# Patient Record
Sex: Female | Born: 1960 | Race: White | Hispanic: No | Marital: Single | Smoking: Never smoker
Health system: Southern US, Community
[De-identification: ages and names within clinical notes are randomized; demographics above are authoritative.]

## PROBLEM LIST (undated history)

## (undated) DIAGNOSIS — Z923 Personal history of irradiation: Secondary | ICD-10-CM

## (undated) DIAGNOSIS — Z87442 Personal history of urinary calculi: Secondary | ICD-10-CM

## (undated) DIAGNOSIS — C801 Malignant (primary) neoplasm, unspecified: Secondary | ICD-10-CM

## (undated) DIAGNOSIS — M542 Cervicalgia: Secondary | ICD-10-CM

## (undated) DIAGNOSIS — E559 Vitamin D deficiency, unspecified: Secondary | ICD-10-CM

## (undated) DIAGNOSIS — I10 Essential (primary) hypertension: Secondary | ICD-10-CM

## (undated) DIAGNOSIS — R112 Nausea with vomiting, unspecified: Secondary | ICD-10-CM

## (undated) DIAGNOSIS — C50919 Malignant neoplasm of unspecified site of unspecified female breast: Secondary | ICD-10-CM

## (undated) DIAGNOSIS — Z9889 Other specified postprocedural states: Secondary | ICD-10-CM

## (undated) DIAGNOSIS — Z86010 Personal history of colonic polyps: Secondary | ICD-10-CM

## (undated) DIAGNOSIS — R350 Frequency of micturition: Secondary | ICD-10-CM

## (undated) HISTORY — PX: OTHER SURGICAL HISTORY: SHX169

## (undated) HISTORY — DX: Malignant neoplasm of unspecified site of unspecified female breast: C50.919

## (undated) HISTORY — PX: COLONOSCOPY: SHX174

## (undated) HISTORY — DX: Essential (primary) hypertension: I10

## (undated) HISTORY — DX: Malignant (primary) neoplasm, unspecified: C80.1

---

## 1982-04-17 HISTORY — PX: BACK SURGERY: SHX140

## 1996-04-17 HISTORY — PX: TUBAL LIGATION: SHX77

## 1998-04-17 HISTORY — PX: CHOLECYSTECTOMY: SHX55

## 1998-05-17 ENCOUNTER — Other Ambulatory Visit: Admission: RE | Admit: 1998-05-17 | Discharge: 1998-05-17 | Payer: Self-pay | Admitting: Gynecology

## 1999-01-27 ENCOUNTER — Other Ambulatory Visit: Admission: RE | Admit: 1999-01-27 | Discharge: 1999-01-27 | Payer: Self-pay | Admitting: Gynecology

## 1999-02-02 ENCOUNTER — Encounter: Payer: Self-pay | Admitting: Endocrinology

## 1999-02-02 ENCOUNTER — Ambulatory Visit (HOSPITAL_COMMUNITY): Admission: RE | Admit: 1999-02-02 | Discharge: 1999-02-02 | Payer: Self-pay | Admitting: Endocrinology

## 1999-02-19 ENCOUNTER — Encounter: Payer: Self-pay | Admitting: Orthopedic Surgery

## 1999-02-19 ENCOUNTER — Encounter: Admission: RE | Admit: 1999-02-19 | Discharge: 1999-02-19 | Payer: Self-pay | Admitting: Orthopedic Surgery

## 1999-03-16 ENCOUNTER — Encounter: Admission: RE | Admit: 1999-03-16 | Discharge: 1999-03-16 | Payer: Self-pay | Admitting: Orthopedic Surgery

## 1999-03-16 ENCOUNTER — Encounter: Payer: Self-pay | Admitting: Orthopedic Surgery

## 1999-04-18 HISTORY — PX: NECK SURGERY: SHX720

## 1999-10-13 ENCOUNTER — Other Ambulatory Visit: Admission: RE | Admit: 1999-10-13 | Discharge: 1999-10-13 | Payer: Self-pay | Admitting: Gynecology

## 1999-10-13 ENCOUNTER — Encounter (INDEPENDENT_AMBULATORY_CARE_PROVIDER_SITE_OTHER): Payer: Self-pay

## 1999-11-11 ENCOUNTER — Encounter: Payer: Self-pay | Admitting: Gynecology

## 1999-11-22 ENCOUNTER — Encounter (INDEPENDENT_AMBULATORY_CARE_PROVIDER_SITE_OTHER): Payer: Self-pay | Admitting: Specialist

## 1999-11-22 ENCOUNTER — Ambulatory Visit (HOSPITAL_COMMUNITY): Admission: RE | Admit: 1999-11-22 | Discharge: 1999-11-22 | Payer: Self-pay | Admitting: Gynecology

## 2000-11-10 ENCOUNTER — Ambulatory Visit (HOSPITAL_COMMUNITY): Admission: RE | Admit: 2000-11-10 | Discharge: 2000-11-10 | Payer: Self-pay | Admitting: Family Medicine

## 2000-11-10 ENCOUNTER — Encounter: Payer: Self-pay | Admitting: Family Medicine

## 2001-01-01 ENCOUNTER — Ambulatory Visit (HOSPITAL_COMMUNITY): Admission: RE | Admit: 2001-01-01 | Discharge: 2001-01-02 | Payer: Self-pay | Admitting: Neurosurgery

## 2001-01-01 ENCOUNTER — Encounter: Payer: Self-pay | Admitting: Neurosurgery

## 2001-01-24 ENCOUNTER — Encounter: Payer: Self-pay | Admitting: Neurosurgery

## 2001-01-24 ENCOUNTER — Ambulatory Visit (HOSPITAL_COMMUNITY): Admission: RE | Admit: 2001-01-24 | Discharge: 2001-01-24 | Payer: Self-pay | Admitting: Neurosurgery

## 2003-09-25 ENCOUNTER — Other Ambulatory Visit: Admission: RE | Admit: 2003-09-25 | Discharge: 2003-09-25 | Payer: Self-pay | Admitting: *Deleted

## 2003-12-11 ENCOUNTER — Encounter: Admission: RE | Admit: 2003-12-11 | Discharge: 2003-12-31 | Payer: Self-pay | Admitting: Unknown Physician Specialty

## 2010-08-16 ENCOUNTER — Encounter (HOSPITAL_BASED_OUTPATIENT_CLINIC_OR_DEPARTMENT_OTHER): Payer: Self-pay

## 2010-09-01 ENCOUNTER — Encounter (HOSPITAL_BASED_OUTPATIENT_CLINIC_OR_DEPARTMENT_OTHER): Payer: Self-pay

## 2010-09-02 NOTE — Op Note (Signed)
[place]  Patient:    Alicia Ferguson, Alicia Ferguson Visit Number: [id number] MRN: [id number]          Service Type: DSU Location: [facility] 3172 02 Attending Physician:  Gerald Dexter Dictated by:   Reinaldo Meeker, M.D. Proc. Date: 01/01/01 Admit Date:  01/01/2001                             Operative Report  PREOPERATIVE DIAGNOSIS:  Herniated disk at C6-7 left.  POSTOPERATIVE DIAGNOSIS:  Herniated disk at C6-7 left.  PROCEDURE:  C6-7 anterior cervical diskectomy with fibular bone bank fusion, followed by anterior cervical plating with operative microscope.  SURGEON:  Reinaldo Meeker, M.D.  ASSISTANT:  Donalee Citrin, Montez Hageman., M.D.  DESCRIPTION OF PROCEDURE:  After being placed in the supine position with 10 pounds of Halter traction, the patients neck was prepped and draped in the usual sterile fashion.  Localizing fluoroscopy was used prior to incision to identify the appropriate level.  Transverse incision was made in the right anterior neck, started at the midline, head towards the medial aspect of the sternocleidomastoid muscle.  The platysma was then incised transversely.  The natural fascial plane between the strap muscles medially and the sternocleidomastoid laterally was identified, followed down to the anterior aspect of the cervical spine.  The longus coli muscles were identified, split in the midline, circulated bilaterally with the ______ elevator.  A second x-ray was taken to confirm appropriate level, this was correct.  A self-retaining retractor was placed for exposure.  Using the 15 blade, the disk was incised.  Using pituitary rongeurs and curettes, approximately 90% of the disk material was removed.  Microscope was then draped, brought into the field, and used at the end of the case.  Using microdissection technique, the remainder of the disk material at the posterior longitudinal ligament was removed.  Ligament was then incised  transversely, cutters removed the Kerrison punch.  Inspection of the left C6-7 foramen yielded a large amount of herniated disk material, and this was removed until the nerve root was well visualized and obviously decompressed.  A similar decompression was carried out towards the right ______ site.  At this point, inspection was carried out in all directions without any evidence of residual compression, none could be identified.  Large amount of irrigation were carried out.  Measurements were taken, and an 8 mm bone bank plug was reconstituted.  After irrigating once more and confirming hemostasis, the plug was impacted without difficulty. Fluoroscopy showed the plug to be in good position.  A 25 mm ______ plate was then chosen.  Under fluoroscopic guidance, drill holes were placed, and 15 mm screws were placed as well.  When all four screws had been well engaged, the locking mechanism was engaged as well.  Fluoroscopy showed the plate to be in excellent position.  At this point, large amounts of irrigation was carried out.  Bleeding controlled by bipolar coagulation.  It was then closed using interrupted Vicryl on the platysma muscle, inverted 5-0 PDS in the subcutaneous layer, and Steri-Strips on the skin.  Sterile dressing was then applied.  The patient was extubated and taken to the recovery room in stable condition. Dictated by:   Reinaldo Meeker, M.D. Attending Physician:  Gerald Dexter DD:  01/01/01 TD:  01/01/01 Job: [id number]

## 2010-09-07 ENCOUNTER — Ambulatory Visit (HOSPITAL_BASED_OUTPATIENT_CLINIC_OR_DEPARTMENT_OTHER): Payer: BC Managed Care – PPO | Attending: Emergency Medicine

## 2010-09-07 DIAGNOSIS — G4733 Obstructive sleep apnea (adult) (pediatric): Secondary | ICD-10-CM | POA: Insufficient documentation

## 2010-09-11 DIAGNOSIS — G4730 Sleep apnea, unspecified: Secondary | ICD-10-CM

## 2010-09-11 DIAGNOSIS — G4710 Hypersomnia, unspecified: Secondary | ICD-10-CM

## 2010-09-11 DIAGNOSIS — R0609 Other forms of dyspnea: Secondary | ICD-10-CM

## 2010-09-11 DIAGNOSIS — R0989 Other specified symptoms and signs involving the circulatory and respiratory systems: Secondary | ICD-10-CM

## 2010-09-12 NOTE — Procedures (Signed)
NAMECHARMON, Alicia Ferguson NO.:  [id number]  MEDICAL RECORD NO.:  [id number]          PATIENT TYPE:  OUT  LOCATION:  SLEEP CENTER                 FACILITY:  [facility]  PHYSICIAN:  Kayci Belleville D. Maple Hudson, MD, FCCP, FACPDATE OF BIRTH:  March 06, 1959  DATE OF STUDY:  09/07/2010                           NOCTURNAL POLYSOMNOGRAM  REFERRING PHYSICIAN:  DAVID CHARLES KELLER  INDICATION FOR STUDY:  Hypersomnia with sleep apnea.  EPWORTH SLEEPINESS SCORE:  7/24, BMI 41.  Weight 260 pounds, height 67 inches.  Neck 16.5 inches.  MEDICATIONS:  Home medication charted and reviewed.  SLEEP ARCHITECTURE:  Total sleep time 351.5 minutes with sleep efficiency 93.5%.  Stage I was 7.4%, stage II 70.6%, stage III 0.9%, REM 21.2% of total sleep time.  Sleep latency 13 minutes, REM latency 122.5 minutes, awake after sleep onset 11.5 minutes, arousal index 6.3. Bedtime medication:  None.  RESPIRATORY DATA:  Apnea/hypopnea index (AHI) 14.3 per hour.  A total of 84 events was scored including 5 obstructive apneas and 79 hypopneas. Events were more common while supine and especially associated with REM. REM AHI 60.4 per hour.  There were insufficient early events to meet protocol requirements for initiation of CPAP titration by split protocol on this study night.  Events were conspicuously associated with REM events in the last half of the night.  OXYGEN DATA:  Moderately loud snoring with oxygen desaturation to a nadir of 74% and a mean oxygen saturation through the study of 94.5% on room air.  CARDIAC DATA:  Normal sinus rhythm.  MOVEMENT-PARASOMNIA:  No significant movement disturbance.  No bathroom trips.  IMPRESSIONS-RECOMMENDATIONS: 1. Mild obstructive sleep apnea/hypopnea syndrome, AHI 14.3 per hour,     moderately loud snoring with oxygen desaturation to a nadir of 74%     and a mean oxygen saturation through the study of 94.5% on room     air.  Events were primarily associated with  supine sleep position     (supine AHI 15 per hour) and REM (REM AHI 60.4 per hour). 2. There were insufficient early events to permit application of CPAP     titration by split titration protocol on the     study night.  Consider return for CPAP titration by dedicated study     or evaluate for alternative management as clinically appropriate.     Kaislyn Gulas D. Maple Hudson, MD, [facility], FACP Diplomate, Biomedical engineer of Sleep Medicine Electronically Signed    CDY/MEDQ  D:  09/11/2010 13:58:40  T:  09/12/2010 00:10:16  Job:  [id number]

## 2010-09-28 ENCOUNTER — Ambulatory Visit (HOSPITAL_BASED_OUTPATIENT_CLINIC_OR_DEPARTMENT_OTHER): Payer: BC Managed Care – PPO | Attending: Emergency Medicine

## 2010-09-28 DIAGNOSIS — G4710 Hypersomnia, unspecified: Secondary | ICD-10-CM | POA: Insufficient documentation

## 2010-09-28 DIAGNOSIS — G4730 Sleep apnea, unspecified: Secondary | ICD-10-CM | POA: Insufficient documentation

## 2010-10-01 DIAGNOSIS — G4710 Hypersomnia, unspecified: Secondary | ICD-10-CM

## 2010-10-01 DIAGNOSIS — G4730 Sleep apnea, unspecified: Secondary | ICD-10-CM

## 2010-10-01 NOTE — Procedures (Signed)
NAMECALIANA, SPIRES NO.:  [id number]  MEDICAL RECORD NO.:  [id number]          PATIENT TYPE:  OUT  LOCATION:  SLEEP CENTER                 FACILITY:  [facility]  PHYSICIAN:  Clinton D. Maple Hudson, MD, FCCP, FACPDATE OF BIRTH:  04[birth year]09-[birth year]  DATE OF STUDY:  09/28/2010                           NOCTURNAL POLYSOMNOGRAM  REFERRING PHYSICIAN:  DAVID CHARLES KELLER  INDICATION FOR STUDY:  Hypersomnia with sleep apnea.  EPWORTH SLEEPINESS SCORE:  Epworth sleepiness score 8/24, BMI 40.7. Weight 260 pounds, height 67 inches.  Neck 16.5 inches.  Home medications are charted and reviewed.  A baseline diagnostic NPSG on Sep 07, 2010, recorded an API of 14.3 per hour.  CPAP titration is requested.  MEDICATIONS:  SLEEP ARCHITECTURE:  Total sleep time 344.5 minutes with sleep efficiency 90.8%.  Stage I was 5.4%, stage II 76.6%, stage III absent, REM 18% of total sleep time.  Sleep latency 15 minutes, REM latency 119.5 minutes, awake after sleep onset 19 minutes, arousal index 15.3 Bedtime medication:  None.  RESPIRATORY DATA:  Split CPAP titration protocol.  CPAP was titrated to 14 CWP, AHI 0.8 per hour.  She wore a standard ResMed Mirage affects nasal mask with heated humidifier and C-flex setting of 2.  OXYGEN DATA:  Snoring was prevented by CPAP and mean oxygen saturation held 95.1% on room air.  CARDIAC DATA:  Sinus rhythm with rare PAC.  MOVEMENT-PARASOMNIA:  No significant movement disturbance.  No bathroom trips.  IMPRESSIONS-RECOMMENDATIONS: 1. Successful CPAP titration to 14 CWP, AHI 0.8 per hour.  She wore a     standard ResMed Mirage effects nasal mask with heated humidifier     and C-flex setting of 2. 2. Baseline diagnostic NPSG on Sep 07, 2010 and recorded an AHI of     14.3 per hour.     Clinton D. Maple Hudson, MD, [facility], FACP Diplomate, Biomedical engineer of Sleep Medicine Electronically Signed   CDY/MEDQ  D:  10/01/2010 09:59:24  T:  10/01/2010 13:37:29  Job:   [id number]

## 2010-11-18 ENCOUNTER — Encounter: Payer: Self-pay | Admitting: Pulmonary Disease

## 2010-11-18 ENCOUNTER — Ambulatory Visit (INDEPENDENT_AMBULATORY_CARE_PROVIDER_SITE_OTHER): Payer: BC Managed Care – PPO | Admitting: Pulmonary Disease

## 2010-11-18 VITALS — BP 124/86 | HR 76 | Temp 98.0°F | Ht 66.0 in | Wt 263.8 lb

## 2010-11-18 DIAGNOSIS — G4733 Obstructive sleep apnea (adult) (pediatric): Secondary | ICD-10-CM

## 2010-11-18 NOTE — Progress Notes (Deleted)
  Subjective:    Patient ID: Alicia Ferguson, female    DOB: 02-Jun-1960, 50 y.o.   MRN: [id number]  HPI    Review of Systems     Objective:   Physical Exam        Assessment & Plan:

## 2010-11-18 NOTE — Patient Instructions (Signed)
Will set up on cpap at 10 of pressure, and increase to 12 in 2 weeks.  Your goal pressure is 14. Work on weight loss followup with me in 5 weeks.

## 2010-11-18 NOTE — Progress Notes (Signed)
Subjective:     Patient ID: Alicia Ferguson, female   DOB: Dec 20, 1960, 50 y.o.   MRN: [id number]  HPI The pt is a 50y/o female who I have been asked to see for management of osa.  She recently underwent NPSG 08/2010 which showed mild osa, with AHI 14/hr.  Her history is significant for: -loud snoring noted, but no one has mentioned abnormal breathing pattern during sleep. -some awakenings during the night, and nonrestorative sleep noted. -denies alertness issues at work, and feels her concentration/performance is adequate -can doze on occasion at home watching tv, but no issues with sleepiness while driving.   -weight is up 12 pounds over the last 2 yrs.  -does mention that she has difficult to control hypertension.   Sleep Questionnaire: What time do you typically go to bed?( Between what hours) Between 11:30 pm and 12:00 pm How long does it take you to fall asleep? 15 minutes How many times during the night do you wake up? 2 What time do you get out of bed to start your day? 0645 Do you drive or operate heavy machinery in your occupation? No How much has your weight changed (up or down) over the past two years? (In pounds) 12 lb (5.443 kg) Have you ever had a sleep study before? Yes If yes, location of study? [facility] If yes, date of study? June 2012 Do you currently use CPAP? No Do you wear oxygen at any time? No    Review of Systems  Constitutional: Negative for fever and unexpected weight change.  HENT: Negative for ear pain, nosebleeds, congestion, sore throat, rhinorrhea, sneezing, trouble swallowing, dental problem, postnasal drip and sinus pressure.   Eyes: Negative for redness and itching.  Respiratory: Positive for shortness of breath. Negative for cough, chest tightness and wheezing.   Cardiovascular: Negative for palpitations and leg swelling.  Gastrointestinal: Negative for nausea and vomiting.  Genitourinary: Negative for dysuria.  Musculoskeletal: Negative for joint swelling.  Skin:  Negative for rash.  Neurological: Negative for headaches.  Hematological: Does not bruise/bleed easily.  Psychiatric/Behavioral: Negative for dysphoric mood. The patient is not nervous/anxious.        Objective:   Physical Exam Constitutional:  Obese female, no acute distress  HENT:  Nares patent without discharge, enlarged turbinates  Oropharynx without exudate, palate and uvula are mildly elongated.   Eyes:  Perrla, eomi, no scleral icterus  Neck:  No JVD, no TMG  Cardiovascular:  Normal rate, regular rhythm, no rubs or gallops.  No murmurs        Intact distal pulses  Pulmonary :  Normal breath sounds, no stridor or respiratory distress   No rales, rhonchi, or wheezing  Abdominal:  Soft, nondistended, bowel sounds present.  No tenderness noted.   Musculoskeletal:  mild lower extremity edema noted.  Lymph Nodes:  No cervical lymphadenopathy noted  Skin:  No cyanosis noted  Neurologic:  Alert, appropriate, moves all 4 extremities without obvious deficit.      Assessment:       Plan:

## 2010-11-24 ENCOUNTER — Encounter: Payer: Self-pay | Admitting: Pulmonary Disease

## 2010-11-24 NOTE — Assessment & Plan Note (Signed)
The pt has mild osa by her sleep study, but it does have impact on her sleep and on daytime QOL at times.  She is also concerned about its impact on her BP control.  I have outlined the various treatment options for this, including a trial of weight loss alone, upper airway surgery, dental appliance, and finally cpap.  After a long discussion, she has decided to start with cpap while trying to work on weight loss.  This will enable her to see its impact on her symptoms and BP.  I will set the patient up on cpap at a moderate pressure level to allow for desensitization, and will troubleshoot the device over the next 4-6weeks if needed.  The pt is to call me if having issues with tolerance.  Will then optimize the pressure once patient is able to wear cpap on a consistent basis.

## 2010-11-29 ENCOUNTER — Encounter: Payer: Self-pay | Admitting: Internal Medicine

## 2010-12-23 ENCOUNTER — Encounter: Payer: Self-pay | Admitting: Pulmonary Disease

## 2010-12-23 ENCOUNTER — Ambulatory Visit (INDEPENDENT_AMBULATORY_CARE_PROVIDER_SITE_OTHER): Payer: BC Managed Care – PPO | Admitting: Pulmonary Disease

## 2010-12-23 VITALS — BP 138/85 | HR 72 | Temp 98.0°F | Ht 66.0 in | Wt 269.0 lb

## 2010-12-23 DIAGNOSIS — G4733 Obstructive sleep apnea (adult) (pediatric): Secondary | ICD-10-CM

## 2010-12-23 NOTE — Patient Instructions (Signed)
Will change to full face mask.  If tolerating cpap better, please call us so we can arrange to have your pressure increased toward your optimal level.  If not having improved tolerance with mask change, please let me know.

## 2010-12-23 NOTE — Progress Notes (Signed)
  Subjective:    Patient ID: Alicia Ferguson, female    DOB: 09-22-1960, 50 y.o.   MRN: [id number]  HPI The patient comes in today for follow up of her mild obstructive sleep apnea.  She was started on CPAP at her last visit, and has had some issues with tolerance.  The patient is able to get to sleep with the device, but she frequently awakens and sleeps her period without the device, and then finally gets back to sleep with it in the wee hours of the morning.  Her download shows an average of about 5 hours per night, but this is piecemeal.  She does not feel there is an issue with pressure tolerance, but thinks the mask is causing her awakening issues.  Patient does not feel that she sleeps better or is any more rested during the day since being on CPAP.   Review of Systems  Constitutional: Positive for unexpected weight change. Negative for fever.  HENT: Positive for sore throat and sinus pressure. Negative for ear pain, nosebleeds, congestion, rhinorrhea, sneezing, trouble swallowing, dental problem and postnasal drip.   Eyes: Negative for redness and itching.  Respiratory: Negative for cough, chest tightness, shortness of breath and wheezing.   Cardiovascular: Positive for leg swelling. Negative for palpitations.  Gastrointestinal: Negative for nausea and vomiting.  Genitourinary: Negative for dysuria.  Musculoskeletal: Negative for joint swelling.  Skin: Negative for rash.  Neurological: Negative for headaches.  Hematological: Does not bruise/bleed easily.  Psychiatric/Behavioral: Negative for dysphoric mood. The patient is not nervous/anxious.        Objective:   Physical Exam Obese female in no acute distress No skin breakdown or pressure necrosis from the CPAP mask Lower extremities without edema, no cyanosis noted Alert, does not appear to be sleepy, moves all 4 extremities.       Assessment & Plan:

## 2010-12-23 NOTE — Assessment & Plan Note (Signed)
The patient has been wearing CPAP since the last visit, but is having issues with getting consistent sleep on the device.  She believes it is an issue with the mask, and therefore will change her to a full face mask as a trial.  I have also reminded her that we have yet to get her pressure to the optimal level of 14 cm.  I would like to try her on a full face mask, and if she continues to have issues with tolerance would consider a trial of desensitization with trazodone at bedtime.  I have also reminded the patient that her sleep apnea is mild in nature, and may not be the sole reason why she is having sleep issues.

## 2011-01-11 ENCOUNTER — Other Ambulatory Visit: Payer: Self-pay | Admitting: Pulmonary Disease

## 2011-01-11 ENCOUNTER — Telehealth: Payer: Self-pay | Admitting: Pulmonary Disease

## 2011-01-11 DIAGNOSIS — G4733 Obstructive sleep apnea (adult) (pediatric): Secondary | ICD-10-CM

## 2011-01-11 NOTE — Telephone Encounter (Signed)
Called and spoke with pt.  Pt aware of order sent to DME to increase pressure.

## 2011-01-11 NOTE — Telephone Encounter (Signed)
Will send an order to dme to increase pressure to 14cm.

## 2011-01-11 NOTE — Telephone Encounter (Signed)
Spoke with pt. She states that she never received FFM as suggested at last visit 12/23/10. She states that she is not having any more problems with waking up at night, and so does not want to FFM anymore, and wants to just go ahead and have pressure adjusted to 14 as she states was discussed at ov. She states that the nasal mask works fine for her. KC, pls advise, thanks!

## 2011-10-27 ENCOUNTER — Other Ambulatory Visit: Payer: Self-pay | Admitting: Emergency Medicine

## 2011-10-27 DIAGNOSIS — Z1231 Encounter for screening mammogram for malignant neoplasm of breast: Secondary | ICD-10-CM

## 2011-11-10 ENCOUNTER — Ambulatory Visit: Payer: BC Managed Care – PPO

## 2011-11-17 ENCOUNTER — Ambulatory Visit: Payer: BC Managed Care – PPO

## 2011-12-01 ENCOUNTER — Ambulatory Visit
Admission: RE | Admit: 2011-12-01 | Discharge: 2011-12-01 | Disposition: A | Discharge status: Home / Self Care | Payer: BC Managed Care – PPO | Source: Ambulatory Visit | Attending: Emergency Medicine | Admitting: Emergency Medicine

## 2011-12-01 DIAGNOSIS — Z1231 Encounter for screening mammogram for malignant neoplasm of breast: Secondary | ICD-10-CM

## 2011-12-06 ENCOUNTER — Other Ambulatory Visit: Payer: Self-pay | Admitting: Emergency Medicine

## 2011-12-06 ENCOUNTER — Other Ambulatory Visit: Payer: Self-pay | Admitting: Family Medicine

## 2011-12-06 DIAGNOSIS — R928 Other abnormal and inconclusive findings on diagnostic imaging of breast: Secondary | ICD-10-CM

## 2011-12-13 ENCOUNTER — Other Ambulatory Visit: Payer: BC Managed Care – PPO

## 2012-01-29 ENCOUNTER — Ambulatory Visit
Admission: RE | Admit: 2012-01-29 | Discharge: 2012-01-29 | Disposition: A | Discharge status: Home / Self Care | Payer: BC Managed Care – PPO | Source: Ambulatory Visit | Attending: Emergency Medicine | Admitting: Emergency Medicine

## 2012-01-29 ENCOUNTER — Other Ambulatory Visit: Payer: Self-pay | Admitting: Emergency Medicine

## 2012-01-29 DIAGNOSIS — R928 Other abnormal and inconclusive findings on diagnostic imaging of breast: Secondary | ICD-10-CM

## 2012-01-30 ENCOUNTER — Other Ambulatory Visit: Payer: Self-pay | Admitting: Emergency Medicine

## 2012-01-30 ENCOUNTER — Ambulatory Visit
Admission: RE | Admit: 2012-01-30 | Discharge: 2012-01-30 | Disposition: A | Discharge status: Home / Self Care | Payer: BC Managed Care – PPO | Source: Ambulatory Visit | Attending: Emergency Medicine | Admitting: Emergency Medicine

## 2012-01-30 DIAGNOSIS — C50912 Malignant neoplasm of unspecified site of left female breast: Secondary | ICD-10-CM

## 2012-01-30 DIAGNOSIS — R928 Other abnormal and inconclusive findings on diagnostic imaging of breast: Secondary | ICD-10-CM

## 2012-02-02 ENCOUNTER — Encounter: Payer: Self-pay | Admitting: Oncology

## 2012-02-02 ENCOUNTER — Telehealth: Payer: Self-pay | Admitting: Oncology

## 2012-02-02 ENCOUNTER — Other Ambulatory Visit: Payer: Self-pay | Admitting: Oncology

## 2012-02-02 DIAGNOSIS — C50112 Malignant neoplasm of central portion of left female breast: Secondary | ICD-10-CM | POA: Insufficient documentation

## 2012-02-02 DIAGNOSIS — C50119 Malignant neoplasm of central portion of unspecified female breast: Secondary | ICD-10-CM

## 2012-02-02 NOTE — Telephone Encounter (Signed)
I discussed the patients options and she chose to schedule a [facility] appt.  I reviewed the process with the patient and she has my number should she have questions.

## 2012-02-03 ENCOUNTER — Ambulatory Visit
Admission: RE | Admit: 2012-02-03 | Discharge: 2012-02-03 | Disposition: A | Discharge status: Home / Self Care | Payer: BC Managed Care – PPO | Source: Ambulatory Visit | Attending: Emergency Medicine | Admitting: Emergency Medicine

## 2012-02-03 DIAGNOSIS — C50912 Malignant neoplasm of unspecified site of left female breast: Secondary | ICD-10-CM

## 2012-02-03 MED ORDER — GADOBENATE DIMEGLUMINE 529 MG/ML IV SOLN
20.0000 mL | Freq: Once | INTRAVENOUS | Status: AC | PRN
  Administered 2012-02-03: 20 mL via INTRAVENOUS

## 2012-02-06 ENCOUNTER — Other Ambulatory Visit: Payer: Self-pay | Admitting: Emergency Medicine

## 2012-02-06 ENCOUNTER — Telehealth: Payer: Self-pay | Admitting: *Deleted

## 2012-02-06 DIAGNOSIS — R928 Other abnormal and inconclusive findings on diagnostic imaging of breast: Secondary | ICD-10-CM

## 2012-02-06 NOTE — Telephone Encounter (Signed)
Patient called stating that she is having more test next week and would like to wait and come into the [facility] after she has all of her results.  Confirmed 02/14/12 appt w/ pt.

## 2012-02-07 ENCOUNTER — Ambulatory Visit: Payer: BC Managed Care – PPO

## 2012-02-07 ENCOUNTER — Other Ambulatory Visit: Payer: BC Managed Care – PPO

## 2012-02-07 ENCOUNTER — Ambulatory Visit: Payer: BC Managed Care – PPO | Admitting: Oncology

## 2012-02-08 ENCOUNTER — Other Ambulatory Visit: Payer: BC Managed Care – PPO

## 2012-02-12 ENCOUNTER — Ambulatory Visit
Admission: RE | Admit: 2012-02-12 | Discharge: 2012-02-12 | Disposition: A | Discharge status: Home / Self Care | Payer: BC Managed Care – PPO | Source: Ambulatory Visit | Attending: Emergency Medicine | Admitting: Emergency Medicine

## 2012-02-12 ENCOUNTER — Other Ambulatory Visit: Payer: Self-pay | Admitting: Emergency Medicine

## 2012-02-12 DIAGNOSIS — R928 Other abnormal and inconclusive findings on diagnostic imaging of breast: Secondary | ICD-10-CM

## 2012-02-13 ENCOUNTER — Other Ambulatory Visit: Payer: BC Managed Care – PPO

## 2012-02-13 ENCOUNTER — Other Ambulatory Visit: Payer: Self-pay | Admitting: *Deleted

## 2012-02-13 DIAGNOSIS — C50119 Malignant neoplasm of central portion of unspecified female breast: Secondary | ICD-10-CM

## 2012-02-14 ENCOUNTER — Encounter: Payer: Self-pay | Admitting: *Deleted

## 2012-02-14 ENCOUNTER — Ambulatory Visit (HOSPITAL_BASED_OUTPATIENT_CLINIC_OR_DEPARTMENT_OTHER): Payer: BC Managed Care – PPO | Admitting: Surgery

## 2012-02-14 ENCOUNTER — Ambulatory Visit: Payer: BC Managed Care – PPO | Attending: Surgery | Admitting: Physical Therapy

## 2012-02-14 ENCOUNTER — Other Ambulatory Visit (HOSPITAL_BASED_OUTPATIENT_CLINIC_OR_DEPARTMENT_OTHER): Payer: BC Managed Care – PPO

## 2012-02-14 ENCOUNTER — Telehealth: Payer: Self-pay | Admitting: *Deleted

## 2012-02-14 ENCOUNTER — Encounter: Payer: Self-pay | Admitting: Oncology

## 2012-02-14 ENCOUNTER — Other Ambulatory Visit: Payer: Self-pay | Admitting: Oncology

## 2012-02-14 ENCOUNTER — Other Ambulatory Visit (INDEPENDENT_AMBULATORY_CARE_PROVIDER_SITE_OTHER): Payer: Self-pay | Admitting: Surgery

## 2012-02-14 ENCOUNTER — Ambulatory Visit
Admission: RE | Admit: 2012-02-14 | Discharge: 2012-02-14 | Disposition: A | Discharge status: Home / Self Care | Payer: BC Managed Care – PPO | Source: Ambulatory Visit | Attending: Radiation Oncology | Admitting: Radiation Oncology

## 2012-02-14 ENCOUNTER — Encounter: Payer: Self-pay | Admitting: Radiation Oncology

## 2012-02-14 ENCOUNTER — Ambulatory Visit: Payer: BC Managed Care – PPO

## 2012-02-14 ENCOUNTER — Encounter (INDEPENDENT_AMBULATORY_CARE_PROVIDER_SITE_OTHER): Payer: Self-pay | Admitting: Surgery

## 2012-02-14 ENCOUNTER — Encounter: Payer: Self-pay | Admitting: Specialist

## 2012-02-14 ENCOUNTER — Ambulatory Visit (HOSPITAL_BASED_OUTPATIENT_CLINIC_OR_DEPARTMENT_OTHER): Payer: BC Managed Care – PPO | Admitting: Oncology

## 2012-02-14 VITALS — BP 145/89 | HR 79 | Temp 98.2°F | Resp 20 | Ht 66.0 in | Wt 269.6 lb

## 2012-02-14 VITALS — BP 145/89 | HR 79 | Temp 98.2°F | Resp 20 | Ht 66.0 in | Wt 269.0 lb

## 2012-02-14 DIAGNOSIS — C50119 Malignant neoplasm of central portion of unspecified female breast: Secondary | ICD-10-CM

## 2012-02-14 DIAGNOSIS — C50919 Malignant neoplasm of unspecified site of unspecified female breast: Secondary | ICD-10-CM

## 2012-02-14 DIAGNOSIS — C50912 Malignant neoplasm of unspecified site of left female breast: Secondary | ICD-10-CM

## 2012-02-14 DIAGNOSIS — I10 Essential (primary) hypertension: Secondary | ICD-10-CM

## 2012-02-14 DIAGNOSIS — M25619 Stiffness of unspecified shoulder, not elsewhere classified: Secondary | ICD-10-CM | POA: Insufficient documentation

## 2012-02-14 DIAGNOSIS — IMO0001 Reserved for inherently not codable concepts without codable children: Secondary | ICD-10-CM | POA: Insufficient documentation

## 2012-02-14 DIAGNOSIS — Z171 Estrogen receptor negative status [ER-]: Secondary | ICD-10-CM

## 2012-02-14 DIAGNOSIS — R293 Abnormal posture: Secondary | ICD-10-CM | POA: Insufficient documentation

## 2012-02-14 LAB — COMPREHENSIVE METABOLIC PANEL (CC13)
ALT: 38 U/L (ref 0–55)
AST: 23 U/L (ref 5–34)
Alkaline Phosphatase: 58 U/L (ref 40–150)
CO2: 28 mEq/L (ref 22–29)
Sodium: 141 mEq/L (ref 136–145)
Total Bilirubin: 0.71 mg/dL (ref 0.20–1.20)
Total Protein: 6.7 g/dL (ref 6.4–8.3)

## 2012-02-14 LAB — CBC WITH DIFFERENTIAL/PLATELET
Eosinophils Absolute: 0.1 10*3/uL (ref 0.0–0.5)
MCV: 83.7 fL (ref 79.5–101.0)
MONO%: 8.8 % (ref 0.0–14.0)
NEUT#: 3.5 10*3/uL (ref 1.5–6.5)
RBC: 4.48 10*6/uL (ref 3.70–5.45)
RDW: 14.1 % (ref 11.2–14.5)
WBC: 6.6 10*3/uL (ref 3.9–10.3)

## 2012-02-14 LAB — CANCER ANTIGEN 27.29: CA 27.29: 13 U/mL (ref 0–39)

## 2012-02-14 NOTE — Progress Notes (Signed)
Patient ID: Alicia Ferguson, female   DOB: 24-Sep-1960, 51 y.o.   MRN: [id number]  Chief Complaint  Patient presents with  . Breast Cancer    left    HPI Alicia Ferguson is a 51 y.o. female.  She found to have an abnormality in the left breast upper-outer quadrant. 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 MRI with subsequent. There is a secondary abnormality in the outer half of the breast which is subsequently biopsied and proven to be benign. 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 Is not amenable to a stereotactic or ultrasound-guided biopsy so would need to have an MRI guided biopsy.  Patient is otherwise asymptomatic. No significant family history is involved. She is referred to the multidisciplinary breast clinic for evaluation and management recommendations. HPI  Past Medical History  Diagnosis Date  . Hypertension   . Breast cancer     Past Surgical History  Procedure Date  . Cholecystectomy 2000  . Neck surgery 2001  . Back surgery 1983  . Tubal ligation 1998    Family History  Problem Relation Age of Onset  . Allergies Mother   . Asthma Mother   . Colon cancer Maternal Grandmother     Social History History  Substance Use Topics  . Smoking status: Never Smoker   . Smokeless tobacco: Never Used  . Alcohol Use: Yes     maybe 1 beer twice per month    No Known Allergies  Current Outpatient Prescriptions  Medication Sig Dispense Refill  . calcium-vitamin D (OSCAL) 250-125 MG-UNIT per tablet Take 1 tablet by mouth every other day.      . ibuprofen (ADVIL,MOTRIN) 200 MG tablet Take 800 mg by mouth as needed.      . ramipril (ALTACE) 10 MG capsule Take 2 capsules by mouth daily.        Review of Systems Review of Systems  Constitutional: Negative for fever, chills and unexpected weight change.  HENT: Negative for hearing loss, congestion, sore throat, trouble  swallowing and voice change.   Eyes: Negative for visual disturbance.  Respiratory: Negative for cough and wheezing.   Cardiovascular: Negative for chest pain, palpitations and leg swelling.  Gastrointestinal: Negative for nausea, vomiting, abdominal pain, diarrhea, constipation, blood in stool, abdominal distention and anal bleeding.  Genitourinary: Negative for hematuria, vaginal bleeding and difficulty urinating.  Musculoskeletal: Negative for arthralgias.  Skin: Negative for rash and wound.  Neurological: Negative for seizures, syncope and headaches.  Hematological: Negative for adenopathy. Does not bruise/bleed easily.  Psychiatric/Behavioral: Negative for confusion.    Blood pressure 145/89, pulse 79, temperature 98.2 F (36.8 C), temperature source Oral, resp. rate 20, height 5\' 6"  (1.676 m), weight 269 lb (122.018 kg).  Physical Exam Physical Exam  Vitals reviewed. Constitutional: She is oriented to person, place, and time. She appears well-developed and well-nourished. No distress.  HENT:  Head: Normocephalic and atraumatic.  Mouth/Throat: Oropharynx is clear and moist.  Eyes: Conjunctivae normal and EOM are normal. Pupils are equal, round, and reactive to light. No scleral icterus.  Neck: Normal range of motion. Neck supple. No tracheal deviation present. No thyromegaly present.  Cardiovascular: Normal rate, regular rhythm, normal heart sounds and intact distal pulses.  Exam reveals no gallop and no friction rub.   No murmur heard. Pulmonary/Chest: Effort normal and breath sounds normal. No respiratory distress. She has no wheezes. She has no rales.  The breasts are fairly large, and symmetric. There is an ecchymosis laterally from the midline area that was biopsied. This smaller ecchymotic area in the upper inner quadrant with a malignancy with biopsy. There is no palpable mass in either breast. They are nontender. Skin and nipple areas look normal.  Abdominal: Soft.  Bowel sounds are normal. She exhibits no distension and no mass. There is no tenderness. There is no rebound and no guarding.  Musculoskeletal: Normal range of motion. She exhibits no edema and no tenderness.  Lymphadenopathy:    She has no cervical adenopathy.    She has no axillary adenopathy.       Right: No supraclavicular adenopathy present.       Left: No supraclavicular adenopathy present.  Neurological: She is alert and oriented to person, place, and time.  Skin: Skin is warm and dry. No rash noted. She is not diaphoretic. No erythema.  Psychiatric: She has a normal mood and affect. Her behavior is normal. Judgment and thought content normal.    Data Reviewed I have reviewed the mammogram  and MRI films and reports and review them with the radiologist. I reviewed the pathology report and slides with the pathologist.  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 We discussed the multidisciplinary approach to breast cancer which often includes both medical and radiation oncology consultations.  We also discussed surgical options for the treatment of breast cancer including lumpectomy and mastectomy with possible reconstructive surgery. In addition we talked about the evaluation and management of lymph nodes including a description of sentinel lymph node biopsy and axillary dissections. We reviewed potential complications and risks including bleeding, infection, numbness,  lymphedema, and the potential need for additional surgery.  She understands that for patients who are candidate for lumpectomy or mastectomy there is an equal survival rate with either technique, but a slightly higher local recurrence rate with lumpectomy. In addition she knows  that a lumpectomy usually requires postoperative radiation as part of the management of the breast cancer.  We have discussed the likely postoperative course and plans for followup.  I have given the patient some written information that reviewed all of these issues. I believe her questions are answered and that she has a good understanding of the issues. I have discussed the fact I would like to know if the nearby area isn't DCIS and have a biopsy done first so we can be clear as to how much tissue we did take down. She is a good candidate for lumpectomy to be followed by radiation therapy. She apparently is going to need chemotherapy. She'll need a sentinel node evaluation I discussed that with her. She'll also need a Port-A-Cath I discussed the indications risks and complications of Port-A-Cath placement. The lumpectomy will need to be done with a wire localized technique, possibly bracketed.I have discussed the indications for the lumpectomy and described the procedure. She understand that the chance of removal of the abnormal area is very good, but that occasionally we are unable to locate it and may have to do a second procedure. We also discussed the possibility of a second procedure to get additional tissue. Risks of surgery such as bleeding and infection have also been explained, as well as the implications of  not doing the surgery. She understands and wishes to proceed.  She is also interested in genetic counseling so we'll check about the possibility of getting that done. We are also going to arrange for a MRI guided biopsy of the area adjacent to the primary tumor so we can get a good evaluation of the extent of tumor prior to surgery.  I think all of her questions have been answered.       Diontay Rosencrans J 02/14/2012, 3:18 PM

## 2012-02-14 NOTE — Telephone Encounter (Signed)
Gave patient appointment for echo 02-20-2012 at 10:00am  Gave patient appointment for 03-18-2012 at 4:30pm

## 2012-02-14 NOTE — Progress Notes (Signed)
Checked in new patient. No financial issues. °

## 2012-02-14 NOTE — Patient Instructions (Signed)
I will wait to hear the results of the MRI guided biopsy of a third area in your left breast. Currently we will plan to do a wire localized lumpectomy to remove the cancer in the upper inner part of the left breast. Will also plan to remove the sentinel nodes in the left armpit area. If everything is going well we will plan to place a Port-A-Cath she then received chemotherapy following surgery. If you have any questions or concerns about her plans please give me a call. [phone]

## 2012-02-14 NOTE — Progress Notes (Signed)
I met with the patient in the multidisciplinary breast clinic.  She had no one with her.  Selyna indicated her distress level was a "1" and said she is not a Chiropractor."  I gave her information about support group; she did not want a referral at this time to [facility].

## 2012-02-14 NOTE — Progress Notes (Signed)
ID: Alicia Ferguson   DOB: May 25, 1960  MR#: [id number]  CSN#:[id number]  PCP: Daisy Floro, MD GYN:  SU:  OTHER MD: Etter Sjogren   HISTORY OF PRESENT ILLNESS:  INTERVAL HISTORY: Alicia Ferguson had bilateral screening mammography at the [facility] Center 12/01/2011. There were some calcifications in the left breast and a possible mass in the right breast, leading to diagnostic mammography 01/29/2012. There were 2 areas of calcification in the left breast, without associated mass or distortion. In the right breast there was a 3 cm oval area of increased density. Neither of these abnormalities were palpable. Ultrasound was noninformative. On the same day, biopsy of one of the left breast lesions was obtained, and showed ([id number]) invasive ductal carcinoma, grade 1, estrogen and progesterone receptor negative, with an MIB-1 of 51%, and an equivocal HER-2 at 2.02  On 02/03/2012 the patient underwent bilateral breast MRI. In the left breast there was an irregular enhancing mass measuring 2.5 cm, which is the one that had been biopsied. In additional, there was a 7 mm mass anterior to the previously biopsied mass and a third mass in the lower outer quadrant of the left breast. The right breast was unremarkable, and there was no axillary or internal mammary adenopathy noted.  Biopsy of the lateral breast mass on 02/12/2012 showed only fibrocystic changes. ([id number]). The patient's subsequent history is as detailed below.  REVIEW OF SYSTEMS: Alicia Ferguson noted no unusual symptoms or change in her functional status prior to mammography. She did undergo blepharoplasty recently and has noted a little bit of a vision change in her left eye as a result. She is getting her contacts at [facility] as a result. A detailed review of systems today was otherwise entirely negative.  PAST MEDICAL HISTORY: Past Medical History  Diagnosis Date  . Hypertension   . Breast cancer     PAST SURGICAL HISTORY: Past Surgical History    Procedure Date  . Cholecystectomy 2000  . Neck surgery 2001  . Back surgery 1983  . Tubal ligation 1998    FAMILY HISTORY Family History  Problem Relation Age of Onset  . Allergies Mother   . Asthma Mother   . Colon cancer Maternal Grandmother    the patient's father died at the age of 74 from a ruptured brain aneurysm. The patient's mother died of "old age" at 42. The patient has one brother and one sister. The patient's maternal grandmother was diagnosed with colon cancer in her 91s. There is no history of breast or ovarian cancer in the family.  GYNECOLOGIC HISTORY: Menarche age 2, first live birth age 89. Menopause 2006. She is GX P2. 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 She is divorced, and lives alone, with no pets. Her son Alicia Ferguson 29 years old is studying Tourist information centre manager in [place]. Her daughter Alicia Ferguson 64 is studying Special educational needs teacher in [place]   ADVANCED DIRECTIVES: Not in place  HEALTH MAINTENANCE: History  Substance Use Topics  . Smoking status: Never Smoker   . Smokeless tobacco: Never Used  . Alcohol Use: Yes     maybe 1 beer twice per month     Colonoscopy:  PAP:  Bone density:  Lipid panel:  No Known Allergies  Current Outpatient Prescriptions  Medication Sig Dispense Refill  . calcium-vitamin D (OSCAL) 250-125 MG-UNIT per tablet Take 1 tablet by mouth every other day.      . ibuprofen (ADVIL,MOTRIN) 200 MG  tablet Take 800 mg by mouth as needed.      . ramipril (ALTACE) 10 MG capsule Take 2 capsules by mouth daily.        OBJECTIVE: Middle-aged white woman who appears well  Filed Vitals:   02/14/12 1302  BP: 145/89  Pulse: 79  Temp: 98.2 F (36.8 C)  Resp: 20     Body mass index is 43.51 kg/(m^2).    ECOG FS: 0  Sclerae unicteric Oropharynx clear No cervical or supraclavicular adenopathy Lungs no rales or rhonchi Heart regular rate and rhythm Abd benign MSK  no focal spinal tenderness, no peripheral edema Neuro: nonfocal Breasts:  I do not palpate any suspicious masses in either breast. In the left breast in particular there are no skin changes or nipple changes of concern. There is a moderate ecchymosis. The left axilla is benign.:    LAB RESULTS: Lab Results  Component Value Date   WBC 6.6 02/14/2012   NEUTROABS 3.5 02/14/2012   HGB 12.7 02/14/2012   HCT 37.5 02/14/2012   MCV 83.7 02/14/2012   PLT 326 02/14/2012      Chemistry      Component Value Date/Time   NA 141 02/14/2012 1242   K 4.0 02/14/2012 1242   CL 108* 02/14/2012 1242   CO2 28 02/14/2012 1242   BUN 14.0 02/14/2012 1242   CREATININE 0.8 02/14/2012 1242      Component Value Date/Time   CALCIUM 9.0 02/14/2012 1242   ALKPHOS 58 02/14/2012 1242   AST 23 02/14/2012 1242   ALT 38 02/14/2012 1242   BILITOT 0.71 02/14/2012 1242       No results found for this basename: LABCA2    No components found with this basename: LABCA125    No results found for this basename: INR:1;PROTIME:1 in the last 168 hours  Urinalysis No results found for this basename: colorurine, appearanceur, labspec, phurine, glucoseu, hgbur, bilirubinur, ketonesur, proteinur, urobilinogen, nitrite, leukocytesur    STUDIES: US Breast Left  02/12/2012  *RADIOLOGY REPORT*  Clinical Data:  Biopsy-proven left breast DCIS.  Two abnormalities seen on recent MRI.  LEFT BREAST ULTRASOUND  Comparison:  With priors  On physical exam, I do not palpate a discrete mass in the left breast.  Findings: Ultrasound is performed, showing there is a hypoechoic lesion in the left breast at 4 o'clock 8 cm from the nipple measuring 4 x 2 x 4 mm.  This corresponds well with the mass seen with MR imaging.  IMPRESSION: Suspicious left breast mass.  RECOMMENDATION: Ultrasound guided core biopsy of the left breast is recommended. This will be performed and dictated separately.  I have discussed the findings and  recommendations with the patient. Results were also provided in writing at the conclusion of the visit.  BI-RADS CATEGORY 4:  Suspicious abnormality - biopsy should be considered.   Original Report Authenticated By: Littie Deeds. Judyann Munson, M.D.    US Breast Right  01/29/2012  *RADIOLOGY REPORT*  Clinical Data:  Abnormal screening mammogram with possible right breast mass and left breast calcifications.  DIGITAL DIAGNOSTIC BILATERAL MAMMOGRAM  AND RIGHT BREAST ULTRASOUND:  Comparison:  12/01/2011 mammograms.  Findings:  Magnification views of the left breast demonstrate heterogeneous and pleomorphic calcifications within the inner central left breast spanning a 3 x 4 cm area.  A 1 mm cluster of calcifications further within the medial left breast are identified 3 cm medial to the larger cluster.  There is no evidence of distortion or associated mass.  Spot compression views of the right breast demonstrate a 2 x 3 cm circumscribed oval area of increased density with probable internal fat.  On physical exam, no palpable abnormalities identified within the upper or outer right breast.  Ultrasound is performed, showing no evidence of solid or cystic mass, distortion or abnormal areas of shadowing within the upper or outer right breast. No abnormalities identified within the inner left breast, in the area of the suspicious calcifications.  IMPRESSION: Suspicious left breast calcifications - stereotactic biopsy is recommended.  This was discussed with the patient and she desires to proceed to stereotactic guided left breast biopsy which will be performed today.  Focal oval density within the upper outer right breast without sonographic correlate.  This probably represents normal fibroglandular tissue or hamartoma.  46-month follow-up is recommended to ensure stability.  BI-RADS CATEGORY 4:  Suspicious abnormality - biopsy should be considered.  RECOMMENDATION: Stereotactic guided biopsy of the left breast, which will be  performed today but dictated in a separate report.  Right diagnostic mammogram with possible right breast ultrasound in 6 months to follow up likely benign right breast finding.  If the left breast calcifications prove to be neoplastic and MRI is performed, then this area would be better evaluated with that MRI.   Original Report Authenticated By: Rosendo Gros, M.D.    Mr Breast Bilateral W Wo Contrast  02/05/2012  *RADIOLOGY REPORT*  Clinical Data: New diagnosis invasive ductal carcinoma in situ, grade 1.  BILATERAL BREAST MRI WITH AND WITHOUT CONTRAST  Technique: Multiplanar, multisequence MR images of both breasts were obtained prior to and following the intravenous administration of 20ml of Multihance.  Three dimensional images were evaluated at the independent DynaCad workstation.  Comparison:  None.  Findings: Parenchymal enhancement seen bilaterally, right greater than left breast. An irregular enhancing mass with ill-defined margins is seen in the medial central aspect of the left breast, middle third measuring 2.5 x 1.0 x 0.9 cm with biopsy clip artifact corresponding to the area of known malignancy.  Additionally, clumped and linear enhancement is seen in the medial central aspect of the left breast, anteriorly measuring 0.7 x 0.6 x 0.6 cm and a round, enhancing mass with ill-defined margins is seen in the lower outer quadrant of the left breast, middle third measuring 0.6 x 0.6 x 0.7 cm.  No suspicious mass or enhancement seen in the right breast. No axillary or internal mammary adenopathy is seen.  Impression: Known malignancy, left breast.  Two additional areas of enhancement are seen in the left breast which biopsy is recommended. No MRI specific evidence of malignancy, right breast.  RECOMMENDATION: Second look ultrasound is recommended for the mass in the lower outer quadrant of the left breast.  MRI guided biopsy for enhancment in the medial central left breast anteriorly.  THREE-DIMENSIONAL MR  IMAGE RENDERING ON INDEPENDENT WORKSTATION:  Three-dimensional MR images were rendered by post-processing of the original MR data on an independent workstation.  The three- dimensional MR images were interpreted, and findings were reported in the accompanying complete MRI report for this study.  BI-RADS CATEGORY 6:  Known biopsy-proven malignancy - appropriate action should be taken.   Original Report Authenticated By: Hiram Gash, M.D.    [place] Core Biopsy  02/13/2012  **ADDENDUM** CREATED: 02/13/2012 13:04:55  Histologic evaluation demonstrates benign fibrocystic change. There is no evidence of malignancy.  This is concordant.  The patient is scheduled to be seen in the [facility] on  02/14/2012.  If MRI guided biopsy of the second area noted on recent MRI anterior to the positive biopsy site is needed, this can be scheduled.  Results were discussed with the patient by telephone at her request.  She report discomfort at the biopsy site but no other complications.  **END ADDENDUM** SIGNED BY: Cain Saupe, M.D.   02/12/2012  *RADIOLOGY REPORT*  Clinical Data:  Biopsy-proven left breast DCIS.  Suspicious nodule seen on MR imaging and visualized sonographically.  ULTRASOUND GUIDED VACUUM ASSISTED CORE BIOPSY OF THE LEFT BREAST  Comparison: With priors  I met with the patient and we discussed the procedure of ultrasound- guided biopsy, including benefits and alternatives.  We discussed the high likelihood of a successful procedure. We discussed the risks of the procedure including infection, bleeding, tissue injury, clip migration, and inadequate sampling.  Informed written consent was given.  Using sterile technique, 2% lidocaine ultrasound guidance and a 12 gauge vacuum assisted needle biopsy was performed of a nodule in the 4 o'clock region of the left breast using a lateral approach. At the conclusion of the procedure, a ribbon shaped tissue marker clip was deployed  into the biopsy cavity.  Follow-up 2-view mammogram was performed and dictated separately.  IMPRESSION: Ultrasound-guided biopsy of the left breast.  No apparent complications.   Original Report Authenticated By: Daryl Eastern, M.D.    Mm Breast Stereo Biopsy Left  01/29/2012  *RADIOLOGY REPORT*  Clinical Data:  51 year old female with suspicious left breast calcifications - for tissue sampling.  STEREOTACTIC-GUIDED VACUUM ASSISTED BIOPSY OF THE LEFT BREAST AND SPECIMEN RADIOGRAPH  Comparison: Previous exams.  I met with the patient and we discussed the procedure of stereotactic-guided biopsy, including benefits and alternatives. We discussed the high likelihood of a successful procedure. We discussed the risks of the procedure, including infection, bleeding, tissue injury, clip migration, and inadequate sampling. Informed, written consent was given.  Using sterile technique, 2% lidocaine, stereotactic guidance, and a 9 gauge vacuum assisted device, biopsy was performed of the heterogeneous and slightly pleomorphic calcifications within the inner central left breast using a superior approach.  Specimen radiograph was performed, showing calcifications.  Specimens with calcifications are identified for pathology.  At the conclusion of the procedure, a T shaped tissue marker clip was deployed into the biopsy cavity.  Follow-up 2-view mammogram confirmed clip placement to be satisfactory.  IMPRESSION: Stereotactic-guided biopsy of left breast calcifications.  No apparent complications.  Pathology will be followed.  The patient has an appointment to return to the [facility] on 01/30/2012 to receive pathology results and evaluate her biopsy site.   Original Report Authenticated By: Rosendo Gros, M.D.    Mm Breast Surgical Specimen  01/29/2012  *RADIOLOGY REPORT*  Clinical Data:  51 year old female with suspicious left breast calcifications - for tissue sampling.  STEREOTACTIC-GUIDED VACUUM ASSISTED BIOPSY  OF THE LEFT BREAST AND SPECIMEN RADIOGRAPH  Comparison: Previous exams.  I met with the patient and we discussed the procedure of stereotactic-guided biopsy, including benefits and alternatives. We discussed the high likelihood of a successful procedure. We discussed the risks of the procedure, including infection, bleeding, tissue injury, clip migration, and inadequate sampling. Informed, written consent was given.  Using sterile technique, 2% lidocaine, stereotactic guidance, and a 9 gauge vacuum assisted device, biopsy was performed of the heterogeneous and slightly pleomorphic calcifications within the inner central left breast using a superior approach.  Specimen radiograph was performed, showing calcifications.  Specimens with calcifications are identified for pathology.  At the conclusion of the procedure, a T shaped tissue marker clip was deployed into the biopsy cavity.  Follow-up 2-view mammogram confirmed clip placement to be satisfactory.  IMPRESSION: Stereotactic-guided biopsy of left breast calcifications.  No apparent complications.  Pathology will be followed.  The patient has an appointment to return to the [facility] on 01/30/2012 to receive pathology results and evaluate her biopsy site.   Original Report Authenticated By: Rosendo Gros, M.D.    Mm Digital Diagnostic Bilat Ltd  01/29/2012  *RADIOLOGY REPORT*  Clinical Data:  Abnormal screening mammogram with possible right breast mass and left breast calcifications.  DIGITAL DIAGNOSTIC BILATERAL MAMMOGRAM  AND RIGHT BREAST ULTRASOUND:  Comparison:  12/01/2011 mammograms.  Findings:  Magnification views of the left breast demonstrate heterogeneous and pleomorphic calcifications within the inner central left breast spanning a 3 x 4 cm area.  A 1 mm cluster of calcifications further within the medial left breast are identified 3 cm medial to the larger cluster.  There is no evidence of distortion or associated mass.  Spot compression views of  the right breast demonstrate a 2 x 3 cm circumscribed oval area of increased density with probable internal fat.  On physical exam, no palpable abnormalities identified within the upper or outer right breast.  Ultrasound is performed, showing no evidence of solid or cystic mass, distortion or abnormal areas of shadowing within the upper or outer right breast. No abnormalities identified within the inner left breast, in the area of the suspicious calcifications.  IMPRESSION: Suspicious left breast calcifications - stereotactic biopsy is recommended.  This was discussed with the patient and she desires to proceed to stereotactic guided left breast biopsy which will be performed today.  Focal oval density within the upper outer right breast without sonographic correlate.  This probably represents normal fibroglandular tissue or hamartoma.  42-month follow-up is recommended to ensure stability.  BI-RADS CATEGORY 4:  Suspicious abnormality - biopsy should be considered.  RECOMMENDATION: Stereotactic guided biopsy of the left breast, which will be performed today but dictated in a separate report.  Right diagnostic mammogram with possible right breast ultrasound in 6 months to follow up likely benign right breast finding.  If the left breast calcifications prove to be neoplastic and MRI is performed, then this area would be better evaluated with that MRI.   Original Report Authenticated By: Rosendo Gros, M.D.    Mm Digital Diagnostic Unilat [facility]  02/12/2012  *RADIOLOGY REPORT*  Clinical Data:  Biopsy-proven left breast DCIS.  Nodules seen in the lower outer quadrant on MR imaging.  DIGITAL DIAGNOSTIC LEFT MAMMOGRAM  Comparison:  With priors  Findings:  Films are performed following ultrasound guided biopsy of the 4 o'clock region of the left breast.  Mammographic images demonstrate there is a ribbon shaped InRad clip in the lower outer quadrant of the left breast.  IMPRESSION: Status post ultrasound-guided core biopsy of  the left breast with pathology pending.   Original Report Authenticated By: Littie Deeds. ARCEO, M.D.    Mm Radiologist Eval [facility] Mgmt  01/30/2012  *RADIOLOGY REPORT*  ESTABLISHED PATIENT OFFICE VISIT - LEVEL II [phone])  Chief Complaint:  The patient returns for discussion of the pathologic findings after stereotactic core needle biopsy of calcifications in the medial aspect of the left breast.  History:  The patient underwent stereotactic core needle biopsy of calcifications in the medial portion of the right breast on 01/29/2012.  Exam:  Biopsy site in the left upper inner quadrant  is clean and dry with no sign of hematoma or infection.  Pathology: Histologic evaluation demonstrates invasive and in situ ductal carcinoma.  The invasive carcinoma is grade 1. Calcifications are present.  Assessment and Plan:  Results were discussed with the patient. Questions were answered.  She was given Transport planner. Breast MRI was scheduled for 02/03/2012.  The patient is scheduled to be seen in the [facility] on 02/07/2012.   Original Report Authenticated By: Daryl Eastern, M.D.     ASSESSMENT: 51 y.o. [place] woman status post left breast biopsy 01/29/2012 for a clinical T2 N0, stage IIA invasive ductal carcinoma, grade 1 or 2, estrogen and progesterone receptor negative, HER-2 equivocal with a ratio of 2.02, with an MIB-1 of 51%.  (1) biopsy of a second lesion in the lateral left breast was benign. Biopsy of a third lesion, near the already biopsied lesion in the left breast, is pending  PLAN: We spent the better part of her hour-plus visit today discussing the biology of her tumor and her treatment options. The plan will be for her to have a biopsy of the third mass in the left breast, and then proceed to breast conserving surgery. She will need to have a port placed for adjuvant chemotherapy, since her tumor is estrogen and progesterone receptor negative.  The  equivocal HER-2 is difficult to deal with. Certainly of an additional HER-2 study will be obtained from the final pathology. If that is clearly positive, then obviously she will need Herceptin as well as chemotherapy. If that is negative, however, we still have some evidence that at least part of her tumor is at least equivocal he HER-2 positive. Under the circumstances my feeling is that the possible benefits of Herceptin so far out weigh the possible toxicities that in doubtful cases like this, particularly with a ratio of 2 or greater him a even though equivocal, my recommendation likely will be to proceed with Herceptin. I did discuss all this today with Selena Batten. We will make a definitive decision when she returns to see me in a few weeks. The chemotherapy in any case will consist of Cytoxan and Taxotere x4.    Keturah Yerby C    02/14/2012

## 2012-02-14 NOTE — Progress Notes (Signed)
Radiation Oncology         [phone] ________________________________  Initial outpatient Consultation  Name: Alicia Ferguson MRN: [id number]  Date: 02/14/2012  DOB: 1961-03-28  [id number] Alicia Diener, MD  Streck, Reola Mosher, MD   REFERRING PHYSICIAN: Currie Paris, MD  DIAGNOSIS: T2N0M0 central left breast cancer, ER/PR negative HER-2/neu equivocal, Ki-67 51%  HISTORY OF PRESENT ILLNESS::Alicia Ferguson is a 51 y.o. female who was found on screening mammogram to have left breast calcifications associated with a 2 x 3 cm irregularity. MRI measured a 2.5 x 1.0 x 0.9 cm lesion with an adjacent 0.7 cm focus of possible DCIS her tumor in the medial posterior aspect of the breast. There also was a 0.7 cm lateral lesion in the left breast suspicious for neoplasm. The lateral lesion was biopsied and found to be benign.  The lesion that is medial/posterior to the cancerous lesion has not yet been biopsied.  She is otherwise in her usual state of health.   PREVIOUS RADIATION THERAPY: no  PAST MEDICAL HISTORY:  has a past medical history of Hypertension and Breast cancer.    PAST SURGICAL HISTORY: Past Surgical History  Procedure Date  . Cholecystectomy 2000  . Neck surgery 2001  . Back surgery 1983  . Tubal ligation 1998    FAMILY HISTORY: family history includes Allergies in her mother; Asthma in her mother; and Colon cancer in her maternal grandmother.  SOCIAL HISTORY:  reports that she has never smoked. She has never used smokeless tobacco. She reports that she drinks alcohol. She reports that she does not use illicit drugs.  ALLERGIES: Review of patient's allergies indicates no known allergies.  MEDICATIONS:  Current Outpatient Prescriptions  Medication Sig Dispense Refill  . calcium-vitamin D (OSCAL) 250-125 MG-UNIT per tablet Take 1 tablet by mouth every other day.      . ibuprofen (ADVIL,MOTRIN) 200 MG tablet Take 800 mg by mouth as needed.      . ramipril (ALTACE) 10 MG  capsule Take 2 capsules by mouth daily.        REVIEW OF SYSTEMS:  A 15 point review of systems is documented in the electronic medical record. This was obtained by the nursing staff. However, I reviewed this with the patient to discuss relevant findings and make appropriate changes.      PHYSICAL EXAM:  Blood pressure 145/89, pulse 79  temperature 98 degrees  respiratory rate 20  weight 269 pounds  height 5 foot 6  General: Alert and oriented, in no acute distress HEENT: Head is normocephalic. Pupils are equally round and reactive to light. Extraocular movements are intact. Oropharynx is clear. Neck: Neck is supple, no palpable cervical or supraclavicular lymphadenopathy. Notable for horizontal scar over her low neck from prior neck surgery Heart: Regular in rate and rhythm with no murmurs, rubs, or gallops. Chest: Clear to auscultation bilaterally, with no rhonchi, wheezes, or rales. Abdomen: Soft, nontender, nondistended, with no rigidity or guarding. Extremities: No cyanosis or edema. Lymphatics: No concerning lymphadenopathy. Skin: No concerning lesions. Musculoskeletal: symmetric strength and muscle tone throughout. Neurologic: Cranial nerves II through XII are grossly intact. No obvious focalities. Speech is fluent. Coordination is intact. Psychiatric: Judgment and insight are intact. Affect is appropriate. Breasts: Large breasts. Positive for 2 left breast biopsy scars, healing well. Otherwise her breasts are unremarkable with no axillary adenopathy   LABORATORY DATA:  Lab Results  Component Value Date   WBC 6.6 02/14/2012   HGB 12.7 02/14/2012   HCT  37.5 02/14/2012   MCV 83.7 02/14/2012   PLT 326 02/14/2012   Lab Results  Component Value Date   NA 141 02/14/2012   K 4.0 02/14/2012   CL 108* 02/14/2012   CO2 28 02/14/2012   Lab Results  Component Value Date   ALT 38 02/14/2012   AST 23 02/14/2012   ALKPHOS 58 02/14/2012   BILITOT 0.71 02/14/2012        RADIOGRAPHY: US Breast Left  02/12/2012  *RADIOLOGY REPORT*  Clinical Data:  Biopsy-proven left breast DCIS.  Two abnormalities seen on recent MRI.  LEFT BREAST ULTRASOUND  Comparison:  With priors  On physical exam, I do not palpate a discrete mass in the left breast.  Findings: Ultrasound is performed, showing there is a hypoechoic lesion in the left breast at 4 o'clock 8 cm from the nipple measuring 4 x 2 x 4 mm.  This corresponds well with the mass seen with MR imaging.  IMPRESSION: Suspicious left breast mass.  RECOMMENDATION: Ultrasound guided core biopsy of the left breast is recommended. This will be performed and dictated separately.  I have discussed the findings and recommendations with the patient. Results were also provided in writing at the conclusion of the visit.  BI-RADS CATEGORY 4:  Suspicious abnormality - biopsy should be considered.   Original Report Authenticated By: Littie Deeds. Judyann Munson, M.D.    US Breast Right  01/29/2012  *RADIOLOGY REPORT*  Clinical Data:  Abnormal screening mammogram with possible right breast mass and left breast calcifications.  DIGITAL DIAGNOSTIC BILATERAL MAMMOGRAM  AND RIGHT BREAST ULTRASOUND:  Comparison:  12/01/2011 mammograms.  Findings:  Magnification views of the left breast demonstrate heterogeneous and pleomorphic calcifications within the inner central left breast spanning a 3 x 4 cm area.  A 1 mm cluster of calcifications further within the medial left breast are identified 3 cm medial to the larger cluster.  There is no evidence of distortion or associated mass.  Spot compression views of the right breast demonstrate a 2 x 3 cm circumscribed oval area of increased density with probable internal fat.  On physical exam, no palpable abnormalities identified within the upper or outer right breast.  Ultrasound is performed, showing no evidence of solid or cystic mass, distortion or abnormal areas of shadowing within the upper or outer right breast. No  abnormalities identified within the inner left breast, in the area of the suspicious calcifications.  IMPRESSION: Suspicious left breast calcifications - stereotactic biopsy is recommended.  This was discussed with the patient and she desires to proceed to stereotactic guided left breast biopsy which will be performed today.  Focal oval density within the upper outer right breast without sonographic correlate.  This probably represents normal fibroglandular tissue or hamartoma.  16-month follow-up is recommended to ensure stability.  BI-RADS CATEGORY 4:  Suspicious abnormality - biopsy should be considered.  RECOMMENDATION: Stereotactic guided biopsy of the left breast, which will be performed today but dictated in a separate report.  Right diagnostic mammogram with possible right breast ultrasound in 6 months to follow up likely benign right breast finding.  If the left breast calcifications prove to be neoplastic and MRI is performed, then this area would be better evaluated with that MRI.   Original Report Authenticated By: Rosendo Gros, M.D.    Mr Breast Bilateral W Wo Contrast  02/05/2012  *RADIOLOGY REPORT*  Clinical Data: New diagnosis invasive ductal carcinoma in situ, grade 1.  BILATERAL BREAST MRI WITH AND WITHOUT CONTRAST  Technique: Multiplanar, multisequence MR images of both breasts were obtained prior to and following the intravenous administration of 20ml of Multihance.  Three dimensional images were evaluated at the independent DynaCad workstation.  Comparison:  None.  Findings: Parenchymal enhancement seen bilaterally, right greater than left breast. An irregular enhancing mass with ill-defined margins is seen in the medial central aspect of the left breast, middle third measuring 2.5 x 1.0 x 0.9 cm with biopsy clip artifact corresponding to the area of known malignancy.  Additionally, clumped and linear enhancement is seen in the medial central aspect of the left breast, anteriorly measuring  0.7 x 0.6 x 0.6 cm and a round, enhancing mass with ill-defined margins is seen in the lower outer quadrant of the left breast, middle third measuring 0.6 x 0.6 x 0.7 cm.  No suspicious mass or enhancement seen in the right breast. No axillary or internal mammary adenopathy is seen.  Impression: Known malignancy, left breast.  Two additional areas of enhancement are seen in the left breast which biopsy is recommended. No MRI specific evidence of malignancy, right breast.  RECOMMENDATION: Second look ultrasound is recommended for the mass in the lower outer quadrant of the left breast.  MRI guided biopsy for enhancment in the medial central left breast anteriorly.  THREE-DIMENSIONAL MR IMAGE RENDERING ON INDEPENDENT WORKSTATION:  Three-dimensional MR images were rendered by post-processing of the original MR data on an independent workstation.  The three- dimensional MR images were interpreted, and findings were reported in the accompanying complete MRI report for this study.  BI-RADS CATEGORY 6:  Known biopsy-proven malignancy - appropriate action should be taken.   Original Report Authenticated By: Hiram Gash, M.D.    [place] Core Biopsy  02/13/2012  **ADDENDUM** CREATED: 02/13/2012 13:04:55  Histologic evaluation demonstrates benign fibrocystic change. There is no evidence of malignancy.  This is concordant.  The patient is scheduled to be seen in the [facility] on 02/14/2012.  If MRI guided biopsy of the second area noted on recent MRI anterior to the positive biopsy site is needed, this can be scheduled.  Results were discussed with the patient by telephone at her request.  She report discomfort at the biopsy site but no other complications.  **END ADDENDUM** SIGNED BY: Cain Saupe, M.D.   02/12/2012  *RADIOLOGY REPORT*  Clinical Data:  Biopsy-proven left breast DCIS.  Suspicious nodule seen on MR imaging and visualized sonographically.  ULTRASOUND GUIDED VACUUM  ASSISTED CORE BIOPSY OF THE LEFT BREAST  Comparison: With priors  I met with the patient and we discussed the procedure of ultrasound- guided biopsy, including benefits and alternatives.  We discussed the high likelihood of a successful procedure. We discussed the risks of the procedure including infection, bleeding, tissue injury, clip migration, and inadequate sampling.  Informed written consent was given.  Using sterile technique, 2% lidocaine ultrasound guidance and a 12 gauge vacuum assisted needle biopsy was performed of a nodule in the 4 o'clock region of the left breast using a lateral approach. At the conclusion of the procedure, a ribbon shaped tissue marker clip was deployed into the biopsy cavity.  Follow-up 2-view mammogram was performed and dictated separately.  IMPRESSION: Ultrasound-guided biopsy of the left breast.  No apparent complications.   Original Report Authenticated By: Daryl Eastern, M.D.    Mm Breast Stereo Biopsy Left  01/29/2012  *RADIOLOGY REPORT*  Clinical Data:  51 year old female with suspicious left breast calcifications - for tissue sampling.  STEREOTACTIC-GUIDED  VACUUM ASSISTED BIOPSY OF THE LEFT BREAST AND SPECIMEN RADIOGRAPH  Comparison: Previous exams.  I met with the patient and we discussed the procedure of stereotactic-guided biopsy, including benefits and alternatives. We discussed the high likelihood of a successful procedure. We discussed the risks of the procedure, including infection, bleeding, tissue injury, clip migration, and inadequate sampling. Informed, written consent was given.  Using sterile technique, 2% lidocaine, stereotactic guidance, and a 9 gauge vacuum assisted device, biopsy was performed of the heterogeneous and slightly pleomorphic calcifications within the inner central left breast using a superior approach.  Specimen radiograph was performed, showing calcifications.  Specimens with calcifications are identified for pathology.  At the  conclusion of the procedure, a T shaped tissue marker clip was deployed into the biopsy cavity.  Follow-up 2-view mammogram confirmed clip placement to be satisfactory.  IMPRESSION: Stereotactic-guided biopsy of left breast calcifications.  No apparent complications.  Pathology will be followed.  The patient has an appointment to [facility]2012 to receive pathology results and evaluate her biopsy site.   Original Report Authenticated By: Rosendo Gros, M.D.    Mm Breast Surgical Specimen  01/29/2012  *RADIOLOGY REPORT*  Clinical Data:  51 year old female with suspicious left breast calcifications - for tissue sampling.  STEREOTACTIC-GUIDED VACUUM ASSISTED BIOPSY OF THE LEFT BREAST AND SPECIMEN RADIOGRAPH  Comparison: Previous exams.  I met with the patient and we discussed the procedure of stereotactic-guided biopsy, including benefits and alternatives. We discussed the high likelihood of a successful procedure. We discussed the risks of the procedure, including infection, bleeding, tissue injury, clip migration, and inadequate sampling. Informed, written consent was given.  Using sterile technique, 2% lidocaine, stereotactic guidance, and a 9 gauge vacuum assisted device, biopsy was performed of the heterogeneous and slightly pleomorphic calcifications within the inner central left breast using a superior approach.  Specimen radiograph was performed, showing calcifications.  Specimens with calcifications are identified for pathology.  At the conclusion of the procedure, a T shaped tissue marker clip was deployed into the biopsy cavity.  Follow-up 2-view mammogram confirmed clip placement to be satisfactory.  IMPRESSION: Stereotactic-guided biopsy of left breast calcifications.  No apparent complications.  Pathology will be followed.  The patient has an appointment to return to the [facility] on 01/30/2012 to receive pathology results and evaluate her biopsy site.   Original Report  Authenticated By: Rosendo Gros, M.D.    Mm Digital Diagnostic Bilat Ltd  01/29/2012  *RADIOLOGY REPORT*  Clinical Data:  Abnormal screening mammogram with possible right breast mass and left breast calcifications.  DIGITAL DIAGNOSTIC BILATERAL MAMMOGRAM  AND RIGHT BREAST ULTRASOUND:  Comparison:  12/01/2011 mammograms.  Findings:  Magnification views of the left breast demonstrate heterogeneous and pleomorphic calcifications within the inner central left breast spanning a 3 x 4 cm area.  A 1 mm cluster of calcifications further within the medial left breast are identified 3 cm medial to the larger cluster.  There is no evidence of distortion or associated mass.  Spot compression views of the right breast demonstrate a 2 x 3 cm circumscribed oval area of increased density with probable internal fat.  On physical exam, no palpable abnormalities identified within the upper or outer right breast.  Ultrasound is performed, showing no evidence of solid or cystic mass, distortion or abnormal areas of shadowing within the upper or outer right breast. No abnormalities identified within the inner left breast, in the area of the suspicious calcifications.  IMPRESSION: Suspicious  left breast calcifications - stereotactic biopsy is recommended.  This was discussed with the patient and she desires to proceed to stereotactic guided left breast biopsy which will be performed today.  Focal oval density within the upper outer right breast without sonographic correlate.  This probably represents normal fibroglandular tissue or hamartoma.  24-month follow-up is recommended to ensure stability.  BI-RADS CATEGORY 4:  Suspicious abnormality - biopsy should be considered.  RECOMMENDATION: Stereotactic guided biopsy of the left breast, which will be performed today but dictated in a separate report.  Right diagnostic mammogram with possible right breast ultrasound in 6 months to follow up likely benign right breast finding.  If the left  breast calcifications prove to be neoplastic and MRI is performed, then this area would be better evaluated with that MRI.   Original Report Authenticated By: Rosendo Gros, M.D.    Mm Digital Diagnostic Unilat [facility]  02/12/2012  *RADIOLOGY REPORT*  Clinical Data:  Biopsy-proven left breast DCIS.  Nodules seen in the lower outer quadrant on MR imaging.  DIGITAL DIAGNOSTIC LEFT MAMMOGRAM  Comparison:  With priors  Findings:  Films are performed following ultrasound guided biopsy of the 4 o'clock region of the left breast.  Mammographic images demonstrate there is a ribbon shaped InRad clip in the lower outer quadrant of the left breast.  IMPRESSION: Status post ultrasound-guided core biopsy of the left breast with pathology pending.   Original Report Authenticated By: Littie Deeds. ARCEO, M.D.    Mm Radiologist Eval And Mgmt  01/30/2012  *RADIOLOGY REPORT*  ESTABLISHED PATIENT OFFICE VISIT - LEVEL II [phone])  Chief Complaint:  The patient returns for discussion of the pathologic findings after stereotactic core needle biopsy of calcifications in the medial aspect of the left breast.  History:  The patient underwent stereotactic core needle biopsy of calcifications in the medial portion of the right breast on 01/29/2012.  Exam:  Biopsy site in the left upper inner quadrant is clean and dry with no sign of hematoma or infection.  Pathology: Histologic evaluation demonstrates invasive and in situ ductal carcinoma.  The invasive carcinoma is grade 1. Calcifications are present.  Assessment and Plan:  Results were discussed with the patient. Questions were answered.  She was given Transport planner. Breast MRI was scheduled for 02/03/2012.  The patient is scheduled to be seen in the [facility] on 02/07/2012.   Original Report Authenticated By: Daryl Eastern, M.D.       IMPRESSION/PLAN:  Is a very pleasant 51 year old woman with T2, N0, M0 left breast cancer. ER/PR negative  HER-2/neu equivocal  We'll probably need an additional biopsy determine the extent of her lumpectomy if she is intent on breast conservation.  I explained that if she desires breast conservation, she will eventually need radiotherapy to the whole left breast in order to decrease her risk of a in breast recurrence by two thirds  She has been discussed at our multidisciplinary tumor board.  She will probably receive chemotherapy  I talked to her about the option of a mastectomy and informed her that her expected overall survival would be equivalent between mastectomy and breast conservation, based upon randomized controlled data. She seems enthusiastic about breast conservation.  It was a pleasure meeting the patient today. We discussed the risks, benefits, and side effects of radiotherapy. We discussed that radiation would take approximately 6 weeks to complete, possibly seven-weeks. We spoke about acute effects including skin irritation and fatigue as well as much  less common late effects including lung and heart irritation. She may be best treated in the prone position. We spoke about the latest technology that is used to minimize the risk of late effects for breast cancer patients undergoing radiotherapy. No guarantees of treatment were given. The patient is enthusiastic about proceeding with treatment. I look forward to participating in the patient's care. __________________________________________   Lonie Peak, MD

## 2012-02-15 ENCOUNTER — Other Ambulatory Visit: Payer: Self-pay | Admitting: Emergency Medicine

## 2012-02-15 DIAGNOSIS — R928 Other abnormal and inconclusive findings on diagnostic imaging of breast: Secondary | ICD-10-CM

## 2012-02-16 ENCOUNTER — Telehealth: Payer: Self-pay | Admitting: *Deleted

## 2012-02-16 NOTE — Telephone Encounter (Signed)
Dawn called and confirmed 02/23/12 genetic appt w/ pt.

## 2012-02-19 ENCOUNTER — Ambulatory Visit
Admission: RE | Admit: 2012-02-19 | Discharge: 2012-02-19 | Disposition: A | Discharge status: Home / Self Care | Payer: BC Managed Care – PPO | Source: Ambulatory Visit | Attending: Emergency Medicine | Admitting: Emergency Medicine

## 2012-02-19 ENCOUNTER — Other Ambulatory Visit: Payer: Self-pay | Admitting: Diagnostic Radiology

## 2012-02-19 DIAGNOSIS — R928 Other abnormal and inconclusive findings on diagnostic imaging of breast: Secondary | ICD-10-CM

## 2012-02-19 MED ORDER — GADOBENATE DIMEGLUMINE 529 MG/ML IV SOLN
20.0000 mL | Freq: Once | INTRAVENOUS | Status: AC | PRN
  Administered 2012-02-19: 20 mL via INTRAVENOUS

## 2012-02-20 ENCOUNTER — Encounter: Payer: Self-pay | Admitting: *Deleted

## 2012-02-20 ENCOUNTER — Telehealth: Payer: Self-pay | Admitting: Oncology

## 2012-02-20 ENCOUNTER — Telehealth: Payer: Self-pay | Admitting: *Deleted

## 2012-02-20 ENCOUNTER — Ambulatory Visit (HOSPITAL_COMMUNITY): Payer: BC Managed Care – PPO

## 2012-02-20 NOTE — Telephone Encounter (Signed)
S/w the pt and she is aware of the chemo educ class appt on 03/04/2012@10 :00am

## 2012-02-20 NOTE — Telephone Encounter (Signed)
Spoke to pt concerning [facility] from 02/14/12.  Genetic counseling appt made for 11/8.  Appt date and time given.  No questions or concerns regarding dx or treatment care plan voiced.  Encourage pt to call with needs.  Received verbal understanding.  Contact information given.

## 2012-02-23 ENCOUNTER — Other Ambulatory Visit (INDEPENDENT_AMBULATORY_CARE_PROVIDER_SITE_OTHER): Payer: Self-pay | Admitting: Surgery

## 2012-02-23 ENCOUNTER — Other Ambulatory Visit: Payer: BC Managed Care – PPO | Admitting: Lab

## 2012-02-23 ENCOUNTER — Ambulatory Visit (HOSPITAL_BASED_OUTPATIENT_CLINIC_OR_DEPARTMENT_OTHER): Payer: BC Managed Care – PPO | Admitting: Genetic Counselor

## 2012-02-23 ENCOUNTER — Encounter: Payer: Self-pay | Admitting: Genetic Counselor

## 2012-02-23 DIAGNOSIS — IMO0002 Reserved for concepts with insufficient information to code with codable children: Secondary | ICD-10-CM

## 2012-02-23 DIAGNOSIS — C50119 Malignant neoplasm of central portion of unspecified female breast: Secondary | ICD-10-CM

## 2012-02-23 DIAGNOSIS — Z171 Estrogen receptor negative status [ER-]: Secondary | ICD-10-CM

## 2012-02-23 DIAGNOSIS — Z1509 Genetic susceptibility to other malignant neoplasm: Secondary | ICD-10-CM

## 2012-02-23 DIAGNOSIS — C50912 Malignant neoplasm of unspecified site of left female breast: Secondary | ICD-10-CM

## 2012-02-23 NOTE — Progress Notes (Signed)
Dr. Raymond Gurney Magrinat requested a consultation for genetic counseling and risk assessment for Alicia Ferguson, a 51 y.o. female, for discussion of her personal history of breast cancer and family history of colon and prostate cancer. She presents to clinic today to discuss the possibility of a genetic predisposition to cancer, and to further clarify her risks, as well as her family members' risks for cancer.   HISTORY OF PRESENT ILLNESS: In 2013, at the age of 1, Alicia Ferguson was diagnosed with invasive ductal carcinoma of the breast. This will be treated with lumpectomy.    Past Medical History  Diagnosis Date  . Hypertension   . Breast cancer     Past Surgical History  Procedure Date  . Cholecystectomy 2000  . Neck surgery 2001  . Back surgery 1983  . Tubal ligation 1998    History  Substance Use Topics  . Smoking status: Never Smoker   . Smokeless tobacco: Never Used  . Alcohol Use: Yes     Comment: maybe 1 beer twice per month    REPRODUCTIVE HISTORY AND PERSONAL RISK ASSESSMENT FACTORS: Menarche was at age 76.   Menopause at 46 Uterus Intact: Yes Ovaries Intact: Yes G2P2A0 , first live birth at age 59  She has previously undergone treatment for infertility.   OCP use for 5 years   She has not used HRT in the past.    FAMILY HISTORY:  We obtained a detailed, 4-generation family history.  Significant diagnoses are listed below: Family History  Problem Relation Age of Onset  . Allergies Mother   . Asthma Mother   . Colon cancer Maternal Grandmother   . Prostate cancer Brother 61  The patient was diagnosed with, essentially, triple negative breast cancer at age 44.  She has a brother who was diagnosed with prostate cancer at age 15 and a MGM who was diagnosed with colon cancer at 54.  There is no other reported cancer history on either side of her family.  Patient's maternal ancestors are of [place] descent, and paternal ancestors are of [place] descent.  There is no reported Ashkenazi Jewish ancestry. There is no  known consanguinity.  GENETIC COUNSELING RISK ASSESSMENT, DISCUSSION, AND SUGGESTED FOLLOW UP: We reviewed the natural history and genetic etiology of sporadic, familial and hereditary cancer syndromes. About 5-10% of breast cancer is hereditary.  Of this, about 85% is the result of a BRCA1 or BRCA2 mutation.  We reviewed the red flags of hereditary cancer syndromes and the dominant inheritance patterns.   The patient's breast cancer and family history of prostate cancer is suggestive of the following possible diagnosis: hereditary cancer syndrome  We discussed that identification of a hereditary cancer syndrome may help her care providers tailor the patients medical management. If a mutation indicating a hereditary cancer syndrome is detected in this case, the [employer] recommendations would include increased cancer surveillence and possible prophylactic surgery. If a mutation is detected, the patient will be referred back to the referring provider and to any additional appropriate care providers to discuss the relevant options.   If a mutation is not found in the patient, this will decrease the likelihood of a hereditary cancer syndrome as the explanation for her breast cancer. Cancer surveillance options would be discussed for the patient according to the appropriate standard National Comprehensive Cancer Network and American Cancer Society guidelines, with consideration of their personal and family history risk factors. In this case, the patient will be referred  back to their care providers for discussions of management.   In order to estimate her chance of having a BRCA mutation, we used statistical models (Penn II) and laboratory data that take into account her personal medical history, family history and ancestry.  Because each model is different, there can be a lot of variability in the risks they give.   Therefore, these numbers must be considered a rough range and not a precise risk of having a BRCA mutation.  These models estimate that she has approximately a 8% chance of having a mutation. Based on this assessment of her family and personal history, genetic testing is recommended.  After considering the risks, benefits, and limitations, the patient provided informed consent for  the following  testing: BR[facility]alsysi through [facility].   Per the patient's request, we will contact her by telephone to discuss these results. A follow up genetic counseling visit will be scheduled if indicated.  The patient was seen for a total of 60 minutes, greater than 50% of which was spent face-to-face counseling.  This plan is being carried out per Dr. Darrall Dears recommendations.  This note will also be sent to the referring provider via the electronic medical record. The patient will be supplied with a summary of this genetic counseling discussion as well as educational information on the discussed hereditary cancer syndromes following the conclusion of their visit.   Patient was discussed with Dr. Drue Second.   _______________________________________________________________________ For Office Staff:  Number of people involved in session: 3 Was an Intern/ student involved with case: no

## 2012-02-26 ENCOUNTER — Telehealth: Payer: Self-pay | Admitting: *Deleted

## 2012-02-26 NOTE — Telephone Encounter (Signed)
Pt called to request f/u appt with Dr. Darnelle Catalan.  Pt very anxious about post surgical treatment.  Informed pt that Dr. Darnelle Catalan was out of office and I would send him a message to get her scheduled for a f/u prior to surgery.  Pt relate that she would be very grateful if he could see her soon.

## 2012-02-27 ENCOUNTER — Ambulatory Visit (INDEPENDENT_AMBULATORY_CARE_PROVIDER_SITE_OTHER): Payer: BC Managed Care – PPO | Admitting: Surgery

## 2012-02-27 ENCOUNTER — Encounter (INDEPENDENT_AMBULATORY_CARE_PROVIDER_SITE_OTHER): Payer: Self-pay | Admitting: Surgery

## 2012-02-27 VITALS — BP 136/82 | HR 80 | Temp 97.6°F | Resp 18 | Ht 66.5 in | Wt 272.4 lb

## 2012-02-27 DIAGNOSIS — C50119 Malignant neoplasm of central portion of unspecified female breast: Secondary | ICD-10-CM

## 2012-02-27 NOTE — Patient Instructions (Signed)
Call if you have any other questions

## 2012-02-28 ENCOUNTER — Ambulatory Visit (HOSPITAL_COMMUNITY)
Admission: RE | Admit: 2012-02-28 | Discharge: 2012-02-28 | Disposition: A | Discharge status: Home / Self Care | Payer: BC Managed Care – PPO | Source: Ambulatory Visit | Attending: Oncology | Admitting: Oncology

## 2012-02-28 DIAGNOSIS — C50119 Malignant neoplasm of central portion of unspecified female breast: Secondary | ICD-10-CM

## 2012-02-28 DIAGNOSIS — G4733 Obstructive sleep apnea (adult) (pediatric): Secondary | ICD-10-CM | POA: Insufficient documentation

## 2012-02-28 DIAGNOSIS — Z01810 Encounter for preprocedural cardiovascular examination: Secondary | ICD-10-CM | POA: Insufficient documentation

## 2012-02-28 DIAGNOSIS — Z01818 Encounter for other preprocedural examination: Secondary | ICD-10-CM | POA: Insufficient documentation

## 2012-02-28 DIAGNOSIS — I517 Cardiomegaly: Secondary | ICD-10-CM

## 2012-02-28 NOTE — Progress Notes (Signed)
NAME: Alicia Ferguson DOB: 11/19/1960 MRN: [id number]                                                                                      DATE: 02/27/2012  PCP: ROSS,CHARLES ALAN, MD Referring Provider: Ross, Charles, MD  IMPRESSION:  Left breast cancer  PLAN:   while localized left partial mastectomy with sentinel lymph node evaluation and Port-A-Cath placement                 CC:  Chief Complaint  Patient presents with  . Breast Cancer    Left    HPI:  Alicia Ferguson is a 51 y.o.  female who presents for discussion of her left breast cancer. She seen several days ago but needed some additional diagnostic tests. There is a possibility she had more extensive disease than initially apparent. However additional biopsies have been negative. She is therefore a candidate for a wire localized lumpectomy with sentinel lymph node evaluation. She points out several skin tags she has inframammary fold she would like to have removed at the same time. These are bilateral. I think she is a good understanding of her diagnosis in the operative plans. She is most likely going to have chemotherapy after surgery and will need a Port-A-Cath we discussed that as well. She seen a medical oncologist later this week for a final decision on that. If he is not 100% sure that she will have chemotherapy postoperatively we will not put the Port-A-Cath in.  PMH:  has a past medical history of Hypertension and Breast cancer.  PSH:   has past surgical history that includes Cholecystectomy (2000); Neck surgery (2001); Back surgery (1983); and Tubal ligation (1998).  ALLERGIES:  No Known Allergies  MEDICATIONS: Current outpatient prescriptions:calcium-vitamin D (OSCAL) 250-125 MG-UNIT per tablet, Take 1 tablet by mouth every other day., Disp: , Rfl: ;  ibuprofen (ADVIL,MOTRIN) 200 MG tablet, Take 800 mg by mouth as needed., Disp: , Rfl: ;  ramipril (ALTACE) 10 MG capsule, Take 2 capsules by mouth daily., Disp: , Rfl:    ROS: She as the changes from her original visit a few days ago. EXAM:   VITAL SIGNS: BP 136/82  Pulse 80  Temp 97.6 F (36.4 C) (Oral)  Resp 18  Ht 5' 6.5" (1.689 m)  Wt 272 lb 6.4 oz (123.56 kg)  BMI 43.31 kg/m2 GENERAL:  The patient is alert, oriented, and generally healthy-appearing, NAD. Mood and affect are normal.  HEENT:  The head is normocephalic, the eyes nonicteric, the pupils were round regular and equal. EOMs are normal. Pharynx normal. Dentition good.  NECK:  The neck is supple and there are no masses or thyromegaly.  LUNGS: Normal respirations and clear to auscultation.  HEART: Regular rhythm, with no murmurs rubs or gallops. Pulses are intact carotid dorsalis pedis and posterior tibial. No significant varicosities are noted.  BREASTS:  the right breast is unremarkable except for some ecchymoses. Left breast is likewise unchanged from her original visit. Of note is that she has several small skin tag or papilloma lesions at the inframammary fold. These are completely benign.  ABDOMEN: Soft, flat, and   nontender. No masses or organomegaly is noted. No hernias are noted. Bowel sounds are normal.  EXTREMITIES:  Good range of motion, no edema.   DATA REVIEWED:  I reviewed the pathology reports the original loads the medical oncology and radiation oncology consultation note again.    Jacinda Kanady J 02/28/2012  CC: Ross, Charles, MD, ROSS,CHARLES ALAN, MD        

## 2012-02-28 NOTE — Progress Notes (Signed)
  Echocardiogram 2D Echocardiogram has been performed.  Alicia Ferguson 02/28/2012, 10:41 AM

## 2012-02-29 ENCOUNTER — Other Ambulatory Visit: Payer: Self-pay | Admitting: Oncology

## 2012-03-04 ENCOUNTER — Other Ambulatory Visit: Payer: Self-pay | Admitting: Physician Assistant

## 2012-03-04 ENCOUNTER — Other Ambulatory Visit: Payer: BC Managed Care – PPO

## 2012-03-06 ENCOUNTER — Telehealth: Payer: Self-pay | Admitting: Oncology

## 2012-03-06 ENCOUNTER — Encounter: Payer: Self-pay | Admitting: *Deleted

## 2012-03-06 ENCOUNTER — Telehealth: Payer: Self-pay | Admitting: *Deleted

## 2012-03-06 ENCOUNTER — Ambulatory Visit (HOSPITAL_BASED_OUTPATIENT_CLINIC_OR_DEPARTMENT_OTHER): Payer: BC Managed Care – PPO | Admitting: Oncology

## 2012-03-06 ENCOUNTER — Telehealth: Payer: Self-pay | Admitting: Genetic Counselor

## 2012-03-06 VITALS — BP 140/94 | HR 78 | Temp 98.2°F | Resp 20 | Ht 66.5 in | Wt 270.3 lb

## 2012-03-06 DIAGNOSIS — C50519 Malignant neoplasm of lower-outer quadrant of unspecified female breast: Secondary | ICD-10-CM

## 2012-03-06 DIAGNOSIS — Z171 Estrogen receptor negative status [ER-]: Secondary | ICD-10-CM

## 2012-03-06 DIAGNOSIS — C50119 Malignant neoplasm of central portion of unspecified female breast: Secondary | ICD-10-CM

## 2012-03-06 NOTE — Telephone Encounter (Signed)
Revealed negative BRCA results

## 2012-03-06 NOTE — Telephone Encounter (Signed)
Per staff message and POF I have scheduled appts.  JMW  

## 2012-03-06 NOTE — Telephone Encounter (Signed)
gve the pt her dec 2013 appt calendar. Pt is aware that her chemo appts will be added. Sent michelle a staff message to add the appts

## 2012-03-06 NOTE — Progress Notes (Signed)
[facility]  Clinical Social Work  Clinical Social Work met with patient in office today to complete healthcare advance directives. Ms. Alicia Ferguson designated her sister, Alicia Ferguson, as her healthcare POA.  CSW made copies for patient and will send copy to medical records to be scanned into patient's chart. CSW encouraged patient to contact CSW with any additional needs or concerns.  Kathrin Penner, MSW, LCSW Clinical Social Worker [facility] [phone]

## 2012-03-06 NOTE — Telephone Encounter (Signed)
neulasta jan 3 and every 21 days x4

## 2012-03-06 NOTE — Progress Notes (Signed)
ID: Alicia Ferguson   DOB: 08-Jan-1961  MR#: [id number]  CSN#:[id number]  PCP: Daisy Floro, MD GYN:  SUCicero Duck MD OTHER MD: Etter Sjogren, Marcelyn Bruins   HISTORY OF PRESENT ILLNESS: Alicia Ferguson had bilateral screening mammography at the [facility] 12/01/2011. There were some calcifications in the left breast and a possible mass in the right breast, leading to diagnostic mammography 01/29/2012. There were 2 areas of calcification in the left breast, without associated mass or distortion. In the right breast there was a 3 cm oval area of increased density. Neither of these abnormalities were palpable. Ultrasound was noninformative. On the same day, biopsy of one of the left breast lesions was obtained, and showed ([id number]) invasive ductal carcinoma, grade 1, estrogen and progesterone receptor negative, with an MIB-1 of 51%, and an equivocal HER-2 at 2.02  On 02/03/2012 the patient underwent bilateral breast MRI. In the left breast there was an irregular enhancing mass measuring 2.5 cm, which is the one that had been biopsied. In additional, there was a 7 mm mass anterior to the previously biopsied mass and a third mass in the lower outer quadrant of the left breast. The right breast was unremarkable, and there was no axillary or internal mammary adenopathy noted.  Biopsy of the lateral breast mass on 02/12/2012 showed only fibrocystic changes. ([id number]). The patient's subsequent history is as detailed below.  INTERVAL HISTORY: Alicia Ferguson returns today for followup of her breast cancer. Since the last visit here she had additional biopsies of the left breast, which were benign. She also had an echocardiogram. 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 She is now scheduled for surgery and port placement the first week in December.  REVIEW OF SYSTEMS: She had a significant ecchymosis in the left breast after the recent biopsies. That is resolving. She continues to have right knee pain, but does manage to  play tennis even on heart quarts. She has had a "cold", mostly some sinus symptoms, for a few days now, but no fever, no purulent sputum, but no worsening shortness of breath or pleurisy. A detailed review of systems today was otherwise unremarkable  PAST MEDICAL HISTORY: Past Medical History  Diagnosis Date  . Hypertension   . Breast cancer     PAST SURGICAL HISTORY: Past Surgical History  Procedure Date  . Cholecystectomy 2000  . Neck surgery 2001  . Back surgery 1983  . Tubal ligation 1998    FAMILY HISTORY Family History  Problem Relation Age of Onset  . Allergies Mother   . Asthma Mother   . Colon cancer Maternal Grandmother   . Prostate cancer Brother 99   the patient's father died at the age of 73 from a ruptured brain aneurysm. The patient's mother died of "old age" at 40. The patient has one brother and one sister. The patient's maternal grandmother was diagnosed with colon cancer in her 18s. There is no history of breast or ovarian cancer in the family.  GYNECOLOGIC HISTORY: Menarche age 58, first live birth age 26. Menopause 2006. She is GX P2. 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 She is divorced, and lives alone, with no pets. Her son [name] 11 years old is studying Tourist information centre manager in [place]. Her daughter [name] 23 is studying Special educational needs teacher in [place]   ADVANCED DIRECTIVES: in place; the patient has named her sister, Osvaldo Shipper, asked her healthcare power of attorney. This can be reached through  [phone]  HEALTH MAINTENANCE: History  Substance Use Topics  . Smoking status: Never Smoker   . Smokeless tobacco: Never Used  . Alcohol Use: Yes     Comment: maybe 1 beer twice per month     Colonoscopy:  PAP:  Bone density:  Lipid panel:  No Known Allergies  Current Outpatient Prescriptions  Medication Sig Dispense Refill  . calcium-vitamin D (OSCAL) 250-125 MG-UNIT  per tablet Take 1 tablet by mouth every other day.      . ibuprofen (ADVIL,MOTRIN) 200 MG tablet Take 800 mg by mouth as needed.      . ramipril (ALTACE) 10 MG capsule Take 2 capsules by mouth daily.        OBJECTIVE: Middle-aged white woman in no acute distress Filed Vitals:   03/06/12 1058  BP: 140/94  Pulse: 78  Temp: 98.2 F (36.8 C)  Resp: 20     Body mass index is 42.97 kg/(m^2).    ECOG FS: 0  Sclerae unicteric Oropharynx clear No cervical or supraclavicular adenopathy Lungs no rales or rhonchi Heart regular rate and rhythm Abd benign MSK no focal spinal tenderness, no peripheral edema Neuro: nonfocal Breasts: The right breast is unremarkable. The left breast is status post recent biopsy. There is a moderate ecchymosis present, with mild seroma formation by palpation. The left axilla is benign.:    LAB RESULTS: Lab Results  Component Value Date   WBC 6.6 02/14/2012   NEUTROABS 3.5 02/14/2012   HGB 12.7 02/14/2012   HCT 37.5 02/14/2012   MCV 83.7 02/14/2012   PLT 326 02/14/2012      Chemistry      Component Value Date/Time   NA 141 02/14/2012 1242   K 4.0 02/14/2012 1242   CL 108* 02/14/2012 1242   CO2 28 02/14/2012 1242   BUN 14.0 02/14/2012 1242   CREATININE 0.8 02/14/2012 1242      Component Value Date/Time   CALCIUM 9.0 02/14/2012 1242   ALKPHOS 58 02/14/2012 1242   AST 23 02/14/2012 1242   ALT 38 02/14/2012 1242   BILITOT 0.71 02/14/2012 1242       Lab Results  Component Value Date   LABCA2 13 02/14/2012    No components found with this basename: [id number]    No results found for this basename: INR:1;PROTIME:1 in the last 168 hours  Urinalysis No results found for this basename: colorurine,  appearanceur,  labspec,  phurine,  glucoseu,  hgbur,  bilirubinur,  ketonesur,  proteinur,  urobilinogen,  nitrite,  leukocytesur    STUDIES: US Breast Left  02/12/2012  *RADIOLOGY REPORT*  Clinical Data:  Biopsy-proven left breast DCIS.  Two  abnormalities seen on recent MRI.  LEFT BREAST ULTRASOUND  Comparison:  With priors  On physical exam, I do not palpate a discrete mass in the left breast.  Findings: Ultrasound is performed, showing there is a hypoechoic lesion in the left breast at 4 o'clock 8 cm from the nipple measuring 4 x 2 x 4 mm.  This corresponds well with the mass seen with MR imaging.  IMPRESSION: Suspicious left breast mass.  RECOMMENDATION: Ultrasound guided core biopsy of the left breast is recommended. This will be performed and dictated separately.  I have discussed the findings and recommendations with the patient. Results were also provided in writing at the conclusion of the visit.  BI-RADS CATEGORY 4:  Suspicious abnormality - biopsy should be considered.   Original Report Authenticated By: Littie Deeds. Judyann Munson, M.D.  US Breast Right  01/29/2012  *RADIOLOGY REPORT*  Clinical Data:  Abnormal screening mammogram with possible right breast mass and left breast calcifications.  DIGITAL DIAGNOSTIC BILATERAL MAMMOGRAM  AND RIGHT BREAST ULTRASOUND:  Comparison:  12/01/2011 mammograms.  Findings:  Magnification views of the left breast demonstrate heterogeneous and pleomorphic calcifications within the inner central left breast spanning a 3 x 4 cm area.  A 1 mm cluster of calcifications further within the medial left breast are identified 3 cm medial to the larger cluster.  There is no evidence of distortion or associated mass.  Spot compression views of the right breast demonstrate a 2 x 3 cm circumscribed oval area of increased density with probable internal fat.  On physical exam, no palpable abnormalities identified within the upper or outer right breast.  Ultrasound is performed, showing no evidence of solid or cystic mass, distortion or abnormal areas of shadowing within the upper or outer right breast. No abnormalities identified within the inner left breast, in the area of the suspicious calcifications.  IMPRESSION: Suspicious  left breast calcifications - stereotactic biopsy is recommended.  This was discussed with the patient and she desires to proceed to stereotactic guided left breast biopsy which will be performed today.  Focal oval density within the upper outer right breast without sonographic correlate.  This probably represents normal fibroglandular tissue or hamartoma.  22-month follow-up is recommended to ensure stability.  BI-RADS CATEGORY 4:  Suspicious abnormality - biopsy should be considered.  RECOMMENDATION: Stereotactic guided biopsy of the left breast, which will be performed today but dictated in a separate report.  Right diagnostic mammogram with possible right breast ultrasound in 6 months to follow up likely benign right breast finding.  If the left breast calcifications prove to be neoplastic and MRI is performed, then this area would be better evaluated with that MRI.   Original Report Authenticated By: Rosendo Gros, M.D.    Mr Breast Bilateral W Wo Contrast  02/05/2012  *RADIOLOGY REPORT*  Clinical Data: New diagnosis invasive ductal carcinoma in situ, grade 1.  BILATERAL BREAST MRI WITH AND WITHOUT CONTRAST  Technique: Multiplanar, multisequence MR images of both breasts were obtained prior to and following the intravenous administration of 20ml of Multihance.  Three dimensional images were evaluated at the independent DynaCad workstation.  Comparison:  None.  Findings: Parenchymal enhancement seen bilaterally, right greater than left breast. An irregular enhancing mass with ill-defined margins is seen in the medial central aspect of the left breast, middle third measuring 2.5 x 1.0 x 0.9 cm with biopsy clip artifact corresponding to the area of known malignancy.  Additionally, clumped and linear enhancement is seen in the medial central aspect of the left breast, anteriorly measuring 0.7 x 0.6 x 0.6 cm and a round, enhancing mass with ill-defined margins is seen in the lower outer quadrant of the left  breast, middle third measuring 0.6 x 0.6 x 0.7 cm.  No suspicious mass or enhancement seen in the right breast. No axillary or internal mammary adenopathy is seen.  Impression: Known malignancy, left breast.  Two additional areas of enhancement are seen in the left breast which biopsy is recommended. No MRI specific evidence of malignancy, right breast.  RECOMMENDATION: Second look ultrasound is recommended for the mass in the lower outer quadrant of the left breast.  MRI guided biopsy for enhancment in the medial central left breast anteriorly.  THREE-DIMENSIONAL MR IMAGE RENDERING ON INDEPENDENT WORKSTATION:  Three-dimensional MR images were rendered by post-processing of  the original MR data on an independent workstation.  The three- dimensional MR images were interpreted, and findings were reported in the accompanying complete MRI report for this study.  BI-RADS CATEGORY 6:  Known biopsy-proven malignancy - appropriate action should be taken.   Original Report Authenticated By: Hiram Gash, M.D.    [place] Core Biopsy  02/13/2012  **ADDENDUM** CREATED: 02/13/2012 13:04:55  Histologic evaluation demonstrates benign fibrocystic change. There is no evidence of malignancy.  This is concordant.  The patient is scheduled to be seen in the [facility] on 02/14/2012.  If MRI guided biopsy of the second area noted on recent MRI anterior to the positive biopsy site is needed, this can be scheduled.  Results were discussed with the patient by telephone at her request.  She report discomfort at the biopsy site but no other complications.  **END ADDENDUM** SIGNED BY: Cain Saupe, M.D.   02/12/2012  *RADIOLOGY REPORT*  Clinical Data:  Biopsy-proven left breast DCIS.  Suspicious nodule seen on MR imaging and visualized sonographically.  ULTRASOUND GUIDED VACUUM ASSISTED CORE BIOPSY OF THE LEFT BREAST  Comparison: With priors  I met with the patient and we discussed the procedure  of ultrasound- guided biopsy, including benefits and alternatives.  We discussed the high likelihood of a successful procedure. We discussed the risks of the procedure including infection, bleeding, tissue injury, clip migration, and inadequate sampling.  Informed written consent was given.  Using sterile technique, 2% lidocaine ultrasound guidance and a 12 gauge vacuum assisted needle biopsy was performed of a nodule in the 4 o'clock region of the left breast using a lateral approach. At the conclusion of the procedure, a ribbon shaped tissue marker clip was deployed into the biopsy cavity.  Follow-up 2-view mammogram was performed and dictated separately.  IMPRESSION: Ultrasound-guided biopsy of the left breast.  No apparent complications.   Original Report Authenticated By: Daryl Eastern, M.D.    Mm Breast Stereo Biopsy Left  01/29/2012  *RADIOLOGY REPORT*  Clinical Data:  51 year old female with suspicious left breast calcifications - for tissue sampling.  STEREOTACTIC-GUIDED VACUUM ASSISTED BIOPSY OF THE LEFT BREAST AND SPECIMEN RADIOGRAPH  Comparison: Previous exams.  I met with the patient and we discussed the procedure of stereotactic-guided biopsy, including benefits and alternatives. We discussed the high likelihood of a successful procedure. We discussed the risks of the procedure, including infection, bleeding, tissue injury, clip migration, and inadequate sampling. Informed, written consent was given.  Using sterile technique, 2% lidocaine, stereotactic guidance, and a 9 gauge vacuum assisted device, biopsy was performed of the heterogeneous and slightly pleomorphic calcifications within the inner central left breast using a superior approach.  Specimen radiograph was performed, showing calcifications.  Specimens with calcifications are identified for pathology.  At the conclusion of the procedure, a T shaped tissue marker clip was deployed into the biopsy cavity.  Follow-up 2-view mammogram  confirmed clip placement to be satisfactory.  IMPRESSION: Stereotactic-guided biopsy of left breast calcifications.  No apparent complications.  Pathology will be followed.  The patient has an appointment to return to the [facility] on 01/30/2012 to receive pathology results and evaluate her biopsy site.   Original Report Authenticated By: Rosendo Gros, M.D.    Mm Breast Surgical Specimen  01/29/2012  *RADIOLOGY REPORT*  Clinical Data:  51 year old female with suspicious left breast calcifications - for tissue sampling.  STEREOTACTIC-GUIDED VACUUM ASSISTED BIOPSY OF THE LEFT BREAST AND SPECIMEN RADIOGRAPH  Comparison: Previous exams.  I met with the patient and we discussed the procedure of stereotactic-guided biopsy, including benefits and alternatives. We discussed the high likelihood of a successful procedure. We discussed the risks of the procedure, including infection, bleeding, tissue injury, clip migration, and inadequate sampling. Informed, written consent was given.  Using sterile technique, 2% lidocaine, stereotactic guidance, and a 9 gauge vacuum assisted device, biopsy was performed of the heterogeneous and slightly pleomorphic calcifications within the inner central left breast using a superior approach.  Specimen radiograph was performed, showing calcifications.  Specimens with calcifications are identified for pathology.  At the conclusion of the procedure, a T shaped tissue marker clip was deployed into the biopsy cavity.  Follow-up 2-view mammogram confirmed clip placement to be satisfactory.  IMPRESSION: Stereotactic-guided biopsy of left breast calcifications.  No apparent complications.  Pathology will be followed.  The patient has an appointment to return to the [facility] on 01/30/2012 to receive pathology results and evaluate her biopsy site.   Original Report Authenticated By: Rosendo Gros, M.D.    Mm Digital Diagnostic Bilat Ltd  01/29/2012  *RADIOLOGY REPORT*  Clinical Data:   Abnormal screening mammogram with possible right breast mass and left breast calcifications.  DIGITAL DIAGNOSTIC BILATERAL MAMMOGRAM  AND RIGHT BREAST ULTRASOUND:  Comparison:  12/01/2011 mammograms.  Findings:  Magnification views of the left breast demonstrate heterogeneous and pleomorphic calcifications within the inner central left breast spanning a 3 x 4 cm area.  A 1 mm cluster of calcifications further within the medial left breast are identified 3 cm medial to the larger cluster.  There is no evidence of distortion or associated mass.  Spot compression views of the right breast demonstrate a 2 x 3 cm circumscribed oval area of increased density with probable internal fat.  On physical exam, no palpable abnormalities identified within the upper or outer right breast.  Ultrasound is performed, showing no evidence of solid or cystic mass, distortion or abnormal areas of shadowing within the upper or outer right breast. No abnormalities identified within the inner left breast, in the area of the suspicious calcifications.  IMPRESSION: Suspicious left breast calcifications - stereotactic biopsy is recommended.  This was discussed with the patient and she desires to proceed to stereotactic guided left breast biopsy which will be performed today.  Focal oval density within the upper outer right breast without sonographic correlate.  This probably represents normal fibroglandular tissue or hamartoma.  31-month follow-up is recommended to ensure stability.  BI-RADS CATEGORY 4:  Suspicious abnormality - biopsy should be considered.  RECOMMENDATION: Stereotactic guided biopsy of the left breast, which will be performed today but dictated in a separate report.  Right diagnostic mammogram with possible right breast ultrasound in 6 months to follow up likely benign right breast finding.  If the left breast calcifications prove to be neoplastic and MRI is performed, then this area would be better evaluated with that MRI.    Original Report Authenticated By: Rosendo Gros, M.D.    Mm Digital Diagnostic Unil[facility]  02/12/2012  *RADIOLOGY REPORT*  Clinical Data:  Biopsy-proven left breast DCIS.  Nodules seen in the lower outer quadrant on MR imaging.  DIGITAL DIAGNOSTIC LEFT MAMMOGRAM  Comparison:  With priors  Findings:  Films are performed following ultrasound guided biopsy of the 4 o'clock region of the left breast.  Mammographic images demonstrate there is a ribbon shaped InRad clip in the lower outer quadrant of the left breast.  IMPRESSION: Status post ultrasound-guided core biopsy of  the left breast with pathology pending.   Original Report Authenticated By: Littie Deeds. ARCEO, M.D.    Mm Radiologist Eval And Mgmt  01/30/2012  *RADIOLOGY REPORT*  ESTABLISHED PATIENT OFFICE VISIT - LEVEL II [phone])  Chief Complaint:  The patient returns for discussion of the pathologic findings after stereotactic core needle biopsy of calcifications in the medial aspect of the left breast.  History:  The patient underwent stereotactic core needle biopsy of calcifications in the medial portion of the right breast on 01/29/2012.  Exam:  Biopsy site in the left upper inner quadrant is clean and dry with no sign of hematoma or infection.  Pathology: Histologic evaluation demonstrates invasive and in situ ductal carcinoma.  The invasive carcinoma is grade 1. Calcifications are present.  Assessment and Plan:  Results were discussed with the patient. Questions were answered.  She was given Transport planner. Breast MRI was scheduled for 02/03/2012.  The patient is scheduled to be seen in the [facility] on 02/07/2012.   Original Report Authenticated By: Daryl Eastern, M.D.    Patient: KIMBERLYN, QUIOCHO Collected: 02/19/2012 Client: [facility] Accession: [id number] Received: 02/19/2012 Baird Lyons, MD DOB: [birth year] Age: 23 Gender: F Reported: 02/20/2012 [address] Patient Ph:  [phone] MRN #: [id number] [place], [place] [address] Client Acc#: Chart #: [id number] Phone: [phone] Fax: CC: REPORT OF SURGICAL PATHOLOGY FINAL DIAGNOSIS Diagnosis 1. Breast, left, needle core biopsy, anterior/medial - FIBROCYSTIC CHANGES WITH CALCIFICATIONS. - THERE IS NO EVIDENCE OF MALIGNANCY. - SEE COMMENT. 2. Breast, left, needle core biopsy, lower outer quadrant - HEMANGIOMA. - THERE IS NO EVIDENCE OF MALIGNANCY. - SEE COMMENT. Microscopic Comment 2. The results were called to [facility] on 02/20/2012. (JBK:gt, 02/20/12) Pecola Leisure MD Pathologist, Electronic Signature (Case signed 02/20/2012) Specimen Gross and  ASSESSMENT: 51 y.o. [place] woman status post left breast biopsy 01/29/2012 for a clinical T2 N0, stage IIA invasive ductal carcinoma, grade 1 or 2, estrogen and progesterone receptor negative, HER-2 equivocal with a ratio of 2.02, with an MIB-1 of 51%.  (1) biopsy 02/19/2012 of two additional lesions in the Left breast,  benign  PLAN: The next step is surgery, scheduled for 03/20/2012. She will have a port placed at the same time. We are going to start cyclophosphamide, docetaxel and trastuzumab January 2. This will allow her to finish her chemotherapy well before a trip she is planned for May. Of course she should not travel January, February or March while under treatment. After 4 cycles of chemotherapy she will continue the trastuzumab every 3 weeks to complete one year. She just had an echocardiogram which shows an excellent ejection fraction. She will need a repeat in about 3 months, and we will schedule her with Dr. Gala Romney so she can start the usual protocol. She will see me again December 19 to discuss results of her surgery and to set her up with the appropriate antibiotics. She knows to call for any problems that may develop before the next visit.  MAGRINAT,GUSTAV C    03/06/2012

## 2012-03-08 ENCOUNTER — Telehealth: Payer: Self-pay | Admitting: *Deleted

## 2012-03-08 NOTE — Telephone Encounter (Signed)
Called patient and let her know that disability form was ready for pick up.  Dr. Darnelle Catalan would like her to review it and make sure it is what she needs.  Patient will come on Monday 11/25 or Tues 11/26 to pick it up.

## 2012-03-11 ENCOUNTER — Encounter (HOSPITAL_COMMUNITY): Payer: Self-pay | Admitting: Pharmacy Technician

## 2012-03-11 ENCOUNTER — Encounter: Payer: Self-pay | Admitting: Genetic Counselor

## 2012-03-12 ENCOUNTER — Other Ambulatory Visit (HOSPITAL_COMMUNITY): Payer: BC Managed Care – PPO

## 2012-03-12 ENCOUNTER — Encounter (HOSPITAL_COMMUNITY): Payer: Self-pay

## 2012-03-12 ENCOUNTER — Encounter (HOSPITAL_COMMUNITY)
Admission: RE | Admit: 2012-03-12 | Discharge: 2012-03-12 | Disposition: A | Discharge status: Home / Self Care | Payer: BC Managed Care – PPO | Source: Ambulatory Visit | Attending: Surgery | Admitting: Surgery

## 2012-03-12 ENCOUNTER — Encounter (HOSPITAL_COMMUNITY)
Admission: RE | Admit: 2012-03-12 | Discharge: 2012-03-12 | Disposition: A | Discharge status: Home / Self Care | Payer: BC Managed Care – PPO | Source: Ambulatory Visit | Attending: Anesthesiology | Admitting: Anesthesiology

## 2012-03-12 HISTORY — DX: Other specified postprocedural states: Z98.890

## 2012-03-12 HISTORY — DX: Frequency of micturition: R35.0

## 2012-03-12 HISTORY — DX: Personal history of colonic polyps: Z86.010

## 2012-03-12 HISTORY — DX: Cervicalgia: M54.2

## 2012-03-12 HISTORY — DX: Personal history of urinary calculi: Z87.442

## 2012-03-12 HISTORY — DX: Vitamin D deficiency, unspecified: E55.9

## 2012-03-12 HISTORY — DX: Nausea with vomiting, unspecified: R11.2

## 2012-03-12 LAB — BASIC METABOLIC PANEL
CO2: 30 mEq/L (ref 19–32)
Calcium: 9.2 mg/dL (ref 8.4–10.5)
Chloride: 101 mEq/L (ref 96–112)
Sodium: 139 mEq/L (ref 135–145)

## 2012-03-12 LAB — CBC
MCV: 82.9 fL (ref 78.0–100.0)
Platelets: 372 10*3/uL (ref 150–400)
RBC: 4.86 MIL/uL (ref 3.87–5.11)
WBC: 9.3 10*3/uL (ref 4.0–10.5)

## 2012-03-12 LAB — SURGICAL PCR SCREEN: Staphylococcus aureus: NEGATIVE

## 2012-03-12 NOTE — Progress Notes (Signed)
Sleep study in epic from 2012 

## 2012-03-12 NOTE — Progress Notes (Signed)
Pt doesn't have a cardiologist  Had an echo on 02-28-12 bc getting ready to start chemo  Denies ever having a heart cath or stress test  Dr.Keller is Medical MD   Denies ekg or cxr within past yr

## 2012-03-12 NOTE — Pre-Procedure Instructions (Signed)
20 [name]  03/12/2012   Your procedure is scheduled on:  Wed, Dec 4 @ 10:00 AM  Report to [facility] at 8:00 AM.  Call this number if you have problems the morning of surgery: [phone]   Remember:   Do not eat food:After Midnight.       Do not wear jewelry, make-up or nail polish.  Do not wear lotions, powders, or perfumes. You may wear deodorant.  Do not shave 48 hours prior to surgery.   Do not bring valuables to the hospital.  Contacts, dentures or bridgework may not be worn into surgery.  Leave suitcase in the car. After surgery it may be brought to your room.  For patients admitted to the hospital, checkout time is 11:00 AM the day of discharge.   Patients discharged the day of surgery will not be allowed to drive home.    Special Instructions: Shower using CHG 2 nights before surgery and the night before surgery.  If you shower the day of surgery use CHG.  Use special wash - you have one bottle of CHG for all showers.  You should use approximately 1/3 of the bottle for each shower.   Please read over the following fact sheets that you were given: Pain Booklet, Coughing and Deep Breathing, MRSA Information and Surgical Site Infection Prevention

## 2012-03-18 ENCOUNTER — Ambulatory Visit: Payer: BC Managed Care – PPO | Admitting: Oncology

## 2012-03-18 ENCOUNTER — Other Ambulatory Visit: Payer: BC Managed Care – PPO | Admitting: Lab

## 2012-03-19 NOTE — Progress Notes (Signed)
Pt notified to arrive at 0830

## 2012-03-20 ENCOUNTER — Ambulatory Visit
Admission: RE | Admit: 2012-03-20 | Discharge: 2012-03-20 | Disposition: A | Discharge status: Home / Self Care | Payer: BC Managed Care – PPO | Source: Ambulatory Visit | Attending: Surgery | Admitting: Surgery

## 2012-03-20 ENCOUNTER — Ambulatory Visit (HOSPITAL_COMMUNITY): Payer: BC Managed Care – PPO | Admitting: Anesthesiology

## 2012-03-20 ENCOUNTER — Encounter (HOSPITAL_COMMUNITY): Payer: Self-pay | Admitting: Anesthesiology

## 2012-03-20 ENCOUNTER — Encounter (HOSPITAL_COMMUNITY)
Admission: RE | Disposition: A | Discharge status: Home / Self Care | Payer: Self-pay | Source: Ambulatory Visit | Attending: Surgery

## 2012-03-20 ENCOUNTER — Ambulatory Visit (HOSPITAL_COMMUNITY)
Admission: RE | Admit: 2012-03-20 | Discharge: 2012-03-20 | Disposition: A | Discharge status: Home / Self Care | Payer: BC Managed Care – PPO | Source: Ambulatory Visit | Attending: Surgery | Admitting: Surgery

## 2012-03-20 ENCOUNTER — Ambulatory Visit (HOSPITAL_COMMUNITY): Payer: BC Managed Care – PPO

## 2012-03-20 ENCOUNTER — Telehealth (INDEPENDENT_AMBULATORY_CARE_PROVIDER_SITE_OTHER): Payer: Self-pay | Admitting: General Surgery

## 2012-03-20 ENCOUNTER — Encounter (HOSPITAL_COMMUNITY): Payer: Self-pay | Admitting: *Deleted

## 2012-03-20 ENCOUNTER — Encounter (HOSPITAL_COMMUNITY)
Admission: RE | Admit: 2012-03-20 | Discharge: 2012-03-20 | Disposition: A | Discharge status: Home / Self Care | Payer: BC Managed Care – PPO | Source: Ambulatory Visit | Attending: Surgery | Admitting: Surgery

## 2012-03-20 DIAGNOSIS — K219 Gastro-esophageal reflux disease without esophagitis: Secondary | ICD-10-CM | POA: Insufficient documentation

## 2012-03-20 DIAGNOSIS — C50912 Malignant neoplasm of unspecified site of left female breast: Secondary | ICD-10-CM

## 2012-03-20 DIAGNOSIS — I10 Essential (primary) hypertension: Secondary | ICD-10-CM | POA: Insufficient documentation

## 2012-03-20 DIAGNOSIS — C50219 Malignant neoplasm of upper-inner quadrant of unspecified female breast: Secondary | ICD-10-CM | POA: Insufficient documentation

## 2012-03-20 DIAGNOSIS — E0789 Other specified disorders of thyroid: Secondary | ICD-10-CM | POA: Insufficient documentation

## 2012-03-20 DIAGNOSIS — L909 Atrophic disorder of skin, unspecified: Secondary | ICD-10-CM | POA: Insufficient documentation

## 2012-03-20 DIAGNOSIS — L919 Hypertrophic disorder of the skin, unspecified: Secondary | ICD-10-CM | POA: Insufficient documentation

## 2012-03-20 DIAGNOSIS — D0590 Unspecified type of carcinoma in situ of unspecified breast: Secondary | ICD-10-CM

## 2012-03-20 DIAGNOSIS — Z9089 Acquired absence of other organs: Secondary | ICD-10-CM | POA: Insufficient documentation

## 2012-03-20 DIAGNOSIS — C50919 Malignant neoplasm of unspecified site of unspecified female breast: Secondary | ICD-10-CM | POA: Insufficient documentation

## 2012-03-20 DIAGNOSIS — Z6841 Body Mass Index (BMI) 40.0 and over, adult: Secondary | ICD-10-CM | POA: Insufficient documentation

## 2012-03-20 DIAGNOSIS — G4730 Sleep apnea, unspecified: Secondary | ICD-10-CM | POA: Insufficient documentation

## 2012-03-20 HISTORY — PX: PORTACATH PLACEMENT: SHX2246

## 2012-03-20 HISTORY — PX: BREAST LUMPECTOMY WITH NEEDLE LOCALIZATION AND AXILLARY SENTINEL LYMPH NODE BX: SHX5760

## 2012-03-20 SURGERY — BREAST LUMPECTOMY WITH NEEDLE LOCALIZATION AND AXILLARY SENTINEL LYMPH NODE BX
Anesthesia: General | Site: Chest | Laterality: Right | Wound class: Clean

## 2012-03-20 MED ORDER — DEXTROSE 5 % IV SOLN
INTRAVENOUS | Status: DC | PRN
  Administered 2012-03-20: 11:00:00 via INTRAVENOUS

## 2012-03-20 MED ORDER — ONDANSETRON HCL 4 MG/2ML IJ SOLN
INTRAMUSCULAR | Status: DC | PRN
  Administered 2012-03-20 (×2): 4 mg via INTRAVENOUS

## 2012-03-20 MED ORDER — METHYLENE BLUE 1 % INJ SOLN
INTRAMUSCULAR | Status: DC | PRN
  Administered 2012-03-20: 2 mL via INTRADERMAL

## 2012-03-20 MED ORDER — ARTIFICIAL TEARS OP OINT
TOPICAL_OINTMENT | OPHTHALMIC | Status: DC | PRN
  Administered 2012-03-20: 1 via OPHTHALMIC

## 2012-03-20 MED ORDER — TECHNETIUM TC 99M SULFUR COLLOID FILTERED
1.0000 | Freq: Once | INTRAVENOUS | Status: AC | PRN
  Administered 2012-03-20: 1 via INTRADERMAL

## 2012-03-20 MED ORDER — BUPIVACAINE HCL (PF) 0.25 % IJ SOLN
INTRAMUSCULAR | Status: AC
  Filled 2012-03-20: qty 30

## 2012-03-20 MED ORDER — CEFAZOLIN SODIUM-DEXTROSE 2-3 GM-% IV SOLR
INTRAVENOUS | Status: DC | PRN
  Administered 2012-03-20: 2 g via INTRAVENOUS

## 2012-03-20 MED ORDER — HEPARIN SODIUM (PORCINE) 5000 UNIT/ML IJ SOLN
5000.0000 [IU] | Freq: Once | INTRAMUSCULAR | Status: AC
  Administered 2012-03-20: 5000 [IU] via SUBCUTANEOUS
  Filled 2012-03-20: qty 1

## 2012-03-20 MED ORDER — OXYCODONE HCL 5 MG PO TABS: 5.0000 mg | ORAL_TABLET | Freq: Once | ORAL | Status: DC | PRN

## 2012-03-20 MED ORDER — SCOPOLAMINE 1 MG/3DAYS TD PT72
MEDICATED_PATCH | TRANSDERMAL | Status: AC
  Filled 2012-03-20: qty 1

## 2012-03-20 MED ORDER — MEPERIDINE HCL 25 MG/ML IJ SOLN: 6.2500 mg | INTRAMUSCULAR | Status: DC | PRN

## 2012-03-20 MED ORDER — FENTANYL CITRATE 0.05 MG/ML IJ SOLN
INTRAMUSCULAR | Status: DC | PRN
  Administered 2012-03-20: 150 ug via INTRAVENOUS
  Administered 2012-03-20 (×2): 50 ug via INTRAVENOUS
  Administered 2012-03-20: 100 ug via INTRAVENOUS

## 2012-03-20 MED ORDER — SODIUM CHLORIDE 0.9 % IR SOLN
Status: DC | PRN
  Administered 2012-03-20: 12:00:00

## 2012-03-20 MED ORDER — IOHEXOL 300 MG/ML  SOLN
INTRAMUSCULAR | Status: DC | PRN
  Administered 2012-03-20: 5 mL via INTRAVENOUS

## 2012-03-20 MED ORDER — LACTATED RINGERS IV SOLN
INTRAVENOUS | Status: DC | PRN
  Administered 2012-03-20 (×2): via INTRAVENOUS

## 2012-03-20 MED ORDER — MIDAZOLAM HCL 2 MG/2ML IJ SOLN
1.0000 mg | INTRAMUSCULAR | Status: DC | PRN
  Administered 2012-03-20: 2 mg via INTRAVENOUS

## 2012-03-20 MED ORDER — HEPARIN SOD (PORK) LOCK FLUSH 100 UNIT/ML IV SOLN
INTRAVENOUS | Status: AC
  Filled 2012-03-20: qty 5

## 2012-03-20 MED ORDER — OXYCODONE-ACETAMINOPHEN 5-325 MG PO TABS: 1.0000 | ORAL_TABLET | ORAL | Status: DC | PRN

## 2012-03-20 MED ORDER — METHYLENE BLUE 1 % INJ SOLN
INTRAMUSCULAR | Status: AC
  Filled 2012-03-20: qty 10

## 2012-03-20 MED ORDER — CEFAZOLIN SODIUM 1-5 GM-% IV SOLN
INTRAVENOUS | Status: AC
  Filled 2012-03-20: qty 100

## 2012-03-20 MED ORDER — ACETAMINOPHEN 10 MG/ML IV SOLN
INTRAVENOUS | Status: AC
  Filled 2012-03-20: qty 100

## 2012-03-20 MED ORDER — LIDOCAINE HCL (CARDIAC) 20 MG/ML IV SOLN
INTRAVENOUS | Status: DC | PRN
  Administered 2012-03-20: 50 mg via INTRAVENOUS

## 2012-03-20 MED ORDER — LACTATED RINGERS IV SOLN
INTRAVENOUS | Status: DC
  Administered 2012-03-20: 10:00:00 via INTRAVENOUS

## 2012-03-20 MED ORDER — SODIUM CHLORIDE 0.9 % IJ SOLN
INTRAMUSCULAR | Status: DC | PRN
  Administered 2012-03-20: 3 mL

## 2012-03-20 MED ORDER — OXYCODONE-ACETAMINOPHEN 5-325 MG PO TABS
ORAL_TABLET | ORAL | Status: AC
  Administered 2012-03-20: 1 via ORAL
  Filled 2012-03-20: qty 1

## 2012-03-20 MED ORDER — DEXAMETHASONE SODIUM PHOSPHATE 4 MG/ML IJ SOLN
INTRAMUSCULAR | Status: DC | PRN
  Administered 2012-03-20: 8 mg via INTRAVENOUS

## 2012-03-20 MED ORDER — PROPOFOL 10 MG/ML IV BOLUS
INTRAVENOUS | Status: DC | PRN
  Administered 2012-03-20: 200 mg via INTRAVENOUS

## 2012-03-20 MED ORDER — HYDROMORPHONE HCL PF 1 MG/ML IJ SOLN
INTRAMUSCULAR | Status: AC
  Filled 2012-03-20: qty 1

## 2012-03-20 MED ORDER — MIDAZOLAM HCL 2 MG/2ML IJ SOLN: 0.5000 mg | Freq: Once | INTRAMUSCULAR | Status: DC | PRN

## 2012-03-20 MED ORDER — SCOPOLAMINE 1 MG/3DAYS TD PT72
1.0000 | MEDICATED_PATCH | TRANSDERMAL | Status: DC
  Administered 2012-03-20: 1 via TRANSDERMAL

## 2012-03-20 MED ORDER — LIDOCAINE HCL (PF) 1 % IJ SOLN
INTRAMUSCULAR | Status: AC
  Filled 2012-03-20: qty 30

## 2012-03-20 MED ORDER — HYDROMORPHONE HCL PF 1 MG/ML IJ SOLN
0.2500 mg | INTRAMUSCULAR | Status: DC | PRN
  Administered 2012-03-20: 0.5 mg via INTRAVENOUS

## 2012-03-20 MED ORDER — BUPIVACAINE HCL (PF) 0.25 % IJ SOLN
INTRAMUSCULAR | Status: DC | PRN
  Administered 2012-03-20: 30 mL

## 2012-03-20 MED ORDER — FENTANYL CITRATE 0.05 MG/ML IJ SOLN
INTRAMUSCULAR | Status: AC
  Filled 2012-03-20: qty 2

## 2012-03-20 MED ORDER — PROMETHAZINE HCL 25 MG/ML IJ SOLN
INTRAMUSCULAR | Status: AC
  Filled 2012-03-20: qty 1

## 2012-03-20 MED ORDER — ACETAMINOPHEN 10 MG/ML IV SOLN
1000.0000 mg | Freq: Once | INTRAVENOUS | Status: AC
  Administered 2012-03-20: 1000 mg via INTRAVENOUS

## 2012-03-20 MED ORDER — MIDAZOLAM HCL 2 MG/2ML IJ SOLN
INTRAMUSCULAR | Status: AC
  Filled 2012-03-20: qty 2

## 2012-03-20 MED ORDER — PROMETHAZINE HCL 25 MG/ML IJ SOLN
6.2500 mg | INTRAMUSCULAR | Status: AC | PRN
  Administered 2012-03-20 (×2): 6.25 mg via INTRAVENOUS

## 2012-03-20 MED ORDER — OXYCODONE HCL 5 MG/5ML PO SOLN: 5.0000 mg | Freq: Once | ORAL | Status: DC | PRN

## 2012-03-20 MED ORDER — HEPARIN SOD (PORK) LOCK FLUSH 100 UNIT/ML IV SOLN
INTRAVENOUS | Status: DC | PRN
  Administered 2012-03-20: 500 [IU]

## 2012-03-20 MED ORDER — EPHEDRINE SULFATE 50 MG/ML IJ SOLN
INTRAMUSCULAR | Status: DC | PRN
  Administered 2012-03-20: 15 mg via INTRAVENOUS
  Administered 2012-03-20: 5 mg via INTRAVENOUS

## 2012-03-20 MED ORDER — FENTANYL CITRATE 0.05 MG/ML IJ SOLN
100.0000 ug | Freq: Once | INTRAMUSCULAR | Status: AC
  Administered 2012-03-20: 100 ug via INTRAVENOUS

## 2012-03-20 SURGICAL SUPPLY — 78 items
APPLIER CLIP 9.375 MED OPEN (MISCELLANEOUS)
APPLIER CLIP 9.375 SM OPEN (CLIP) ×3
BAG DECANTER FOR FLEXI CONT (MISCELLANEOUS) ×3 IMPLANT
BINDER BREAST LRG (GAUZE/BANDAGES/DRESSINGS) IMPLANT
BINDER BREAST XLRG (GAUZE/BANDAGES/DRESSINGS) IMPLANT
BLADE SURG 10 STRL SS (BLADE) ×3 IMPLANT
BLADE SURG 15 STRL LF DISP TIS (BLADE) ×2 IMPLANT
BLADE SURG 15 STRL SS (BLADE) ×1
CANISTER SUCTION 2500CC (MISCELLANEOUS) IMPLANT
CHLORAPREP W/TINT 10.5 ML (MISCELLANEOUS) ×3 IMPLANT
CHLORAPREP W/TINT 26ML (MISCELLANEOUS) ×6 IMPLANT
CLIP APPLIE 9.375 MED OPEN (MISCELLANEOUS) IMPLANT
CLIP APPLIE 9.375 SM OPEN (CLIP) ×2 IMPLANT
CLOTH BEACON ORANGE TIMEOUT ST (SAFETY) ×3 IMPLANT
CONT SPEC 4OZ CLIKSEAL STRL BL (MISCELLANEOUS) ×12 IMPLANT
COVER PROBE W GEL 5X96 (DRAPES) ×3 IMPLANT
COVER SURGICAL LIGHT HANDLE (MISCELLANEOUS) ×6 IMPLANT
CRADLE DONUT ADULT HEAD (MISCELLANEOUS) ×3 IMPLANT
DECANTER SPIKE VIAL GLASS SM (MISCELLANEOUS) ×6 IMPLANT
DERMABOND ADVANCED (GAUZE/BANDAGES/DRESSINGS) ×6
DERMABOND ADVANCED .7 DNX12 (GAUZE/BANDAGES/DRESSINGS) ×12 IMPLANT
DEVICE DUBIN SPECIMEN MAMMOGRA (MISCELLANEOUS) ×3 IMPLANT
DRAPE C-ARM 42X72 X-RAY (DRAPES) ×3 IMPLANT
DRAPE CHEST BREAST 15X10 FENES (DRAPES) ×3 IMPLANT
DRAPE LAPAROTOMY TRNSV 102X78 (DRAPE) ×3 IMPLANT
DRAPE UTILITY 15X26 W/TAPE STR (DRAPE) ×6 IMPLANT
ELECT CAUTERY BLADE 6.4 (BLADE) ×3 IMPLANT
ELECT REM PT RETURN 9FT ADLT (ELECTROSURGICAL) ×3
ELECTRODE REM PT RTRN 9FT ADLT (ELECTROSURGICAL) ×2 IMPLANT
GAUZE SPONGE 2X2 8PLY STRL LF (GAUZE/BANDAGES/DRESSINGS) ×2 IMPLANT
GAUZE SPONGE 4X4 16PLY XRAY LF (GAUZE/BANDAGES/DRESSINGS) ×6 IMPLANT
GLOVE BIOGEL PI IND STRL 6.5 (GLOVE) ×6 IMPLANT
GLOVE BIOGEL PI INDICATOR 6.5 (GLOVE) ×3
GLOVE ECLIPSE 6.5 STRL STRAW (GLOVE) ×3 IMPLANT
GLOVE EUDERMIC 7 POWDERFREE (GLOVE) ×3 IMPLANT
GOWN PREVENTION PLUS XLARGE (GOWN DISPOSABLE) ×3 IMPLANT
GOWN STRL NON-REIN LRG LVL3 (GOWN DISPOSABLE) ×9 IMPLANT
INTRODUCER 13FR (MISCELLANEOUS) IMPLANT
INTRODUCER COOK 11FR (CATHETERS) IMPLANT
KIT BASIN OR (CUSTOM PROCEDURE TRAY) ×3 IMPLANT
KIT MARKER MARGIN INK (KITS) ×3 IMPLANT
KIT PORT POWER 9.6FR MRI PREA (Catheter) IMPLANT
KIT PORT POWER ISP 8FR (Catheter) IMPLANT
KIT POWER CATH 8FR (Catheter) ×3 IMPLANT
KIT ROOM TURNOVER OR (KITS) ×3 IMPLANT
MARKER SKIN DUAL TIP RULER LAB (MISCELLANEOUS) ×3 IMPLANT
NEEDLE 18GX1X1/2 (RX/OR ONLY) (NEEDLE) ×3 IMPLANT
NEEDLE HYPO 22GX1.5 SAFETY (NEEDLE) ×3 IMPLANT
NEEDLE HYPO 25GX1X1/2 BEV (NEEDLE) ×6 IMPLANT
NS IRRIG 1000ML POUR BTL (IV SOLUTION) ×3 IMPLANT
PACK SURGICAL SETUP 50X90 (CUSTOM PROCEDURE TRAY) ×3 IMPLANT
PAD ARMBOARD 7.5X6 YLW CONV (MISCELLANEOUS) ×6 IMPLANT
PAD SHARPS MAGNETIC DISPOSAL (MISCELLANEOUS) ×3 IMPLANT
PENCIL BUTTON HOLSTER BLD 10FT (ELECTRODE) ×6 IMPLANT
SET INTRODUCER 12FR PACEMAKER (SHEATH) IMPLANT
SET SHEATH INTRODUCER 10FR (MISCELLANEOUS) IMPLANT
SHEATH COOK PEEL AWAY SET 9F (SHEATH) IMPLANT
SPONGE GAUZE 2X2 STER 10/PKG (GAUZE/BANDAGES/DRESSINGS) ×1
SPONGE LAP 18X18 X RAY DECT (DISPOSABLE) ×3 IMPLANT
SPONGE LAP 4X18 X RAY DECT (DISPOSABLE) ×6 IMPLANT
STAPLER VISISTAT 35W (STAPLE) ×3 IMPLANT
SUT MNCRL AB 4-0 PS2 18 (SUTURE) ×6 IMPLANT
SUT MON AB 4-0 PC3 18 (SUTURE) ×3 IMPLANT
SUT PROLENE 2 0 SH DA (SUTURE) ×3 IMPLANT
SUT SILK 3 0 (SUTURE)
SUT SILK 3-0 18XBRD TIE 12 (SUTURE) IMPLANT
SUT VIC AB 3-0 SH 18 (SUTURE) ×9 IMPLANT
SYR 20CC LL (SYRINGE) ×3 IMPLANT
SYR 20ML ECCENTRIC (SYRINGE) ×6 IMPLANT
SYR 5ML LUER SLIP (SYRINGE) ×3 IMPLANT
SYR CONTROL 10ML LL (SYRINGE) ×6 IMPLANT
SYRINGE 10CC LL (SYRINGE) ×3 IMPLANT
TAPE CLOTH SURG 4X10 WHT LF (GAUZE/BANDAGES/DRESSINGS) ×3 IMPLANT
TOWEL OR 17X24 6PK STRL BLUE (TOWEL DISPOSABLE) ×6 IMPLANT
TOWEL OR 17X26 10 PK STRL BLUE (TOWEL DISPOSABLE) ×6 IMPLANT
TUBE CONNECTING 12X1/4 (SUCTIONS) ×6 IMPLANT
WATER STERILE IRR 1000ML POUR (IV SOLUTION) IMPLANT
YANKAUER SUCT BULB TIP NO VENT (SUCTIONS) ×3 IMPLANT

## 2012-03-20 NOTE — OR Nursing (Addendum)
1st procedure (Breast lumpectomy and sentinel node ) ended at 1236. 2nd procedure (Portacath placement) started at 1257.

## 2012-03-20 NOTE — H&P (View-Only) (Signed)
NAMEROSMARY Ferguson DOB: November 19, 1960 MRN: [id number]                                                                                      DATE: 02/27/2012  PCP: Daisy Floro, MD Referring Provider: Gildardo Cranker, MD  IMPRESSION:  Left breast cancer  PLAN:   while localized left partial mastectomy with sentinel lymph node evaluation and Port-A-Cath placement                 CC:  Chief Complaint  Patient presents with  . Breast Cancer    Left    HPI:  Alicia Ferguson is a 51 y.o.  female who presents for discussion of her left breast cancer. She seen several days ago but needed some additional diagnostic tests. There is a possibility she had more extensive disease than initially apparent. However additional biopsies have been negative. She is therefore a candidate for a wire localized lumpectomy with sentinel lymph node evaluation. She points out several skin tags she has inframammary fold she would like to have removed at the same time. These are bilateral. I think she is a good understanding of her diagnosis in the operative plans. She is most likely going to have chemotherapy after surgery and will need a Port-A-Cath we discussed that as well. She seen a medical oncologist later this week for a final decision on that. If he is not 100% sure that she will have chemotherapy postoperatively we will not put the Port-A-Cath in.  PMH:  has a past medical history of Hypertension and Breast cancer.  PSH:   has past surgical history that includes Cholecystectomy (2000); Neck surgery (2001); Back surgery (1983); and Tubal ligation (1998).  ALLERGIES:  No Known Allergies  MEDICATIONS: Current outpatient prescriptions:calcium-vitamin D (OSCAL) 250-125 MG-UNIT per tablet, Take 1 tablet by mouth every other day., Disp: , Rfl: ;  ibuprofen (ADVIL,MOTRIN) 200 MG tablet, Take 800 mg by mouth as needed., Disp: , Rfl: ;  ramipril (ALTACE) 10 MG capsule, Take 2 capsules by mouth daily., Disp: , Rfl:    ROS: She as the changes from her original visit a few days ago. EXAM:   VITAL SIGNS: BP 136/82  Pulse 80  Temp 97.6 F (36.4 C) (Oral)  Resp 18  Ht 5' 6.5" (1.689 m)  Wt 272 lb 6.4 oz (123.56 kg)  BMI 43.31 kg/m2 GENERAL:  The patient is alert, oriented, and generally healthy-appearing, NAD. Mood and affect are normal.  HEENT:  The head is normocephalic, the eyes nonicteric, the pupils were round regular and equal. EOMs are normal. Pharynx normal. Dentition good.  NECK:  The neck is supple and there are no masses or thyromegaly.  LUNGS: Normal respirations and clear to auscultation.  HEART: Regular rhythm, with no murmurs rubs or gallops. Pulses are intact carotid dorsalis pedis and posterior tibial. No significant varicosities are noted.  BREASTS:  the right breast is unremarkable except for some ecchymoses. Left breast is likewise unchanged from her original visit. Of note is that she has several small skin tag or papilloma lesions at the inframammary fold. These are completely benign.  ABDOMEN: Soft, flat, and  nontender. No masses or organomegaly is noted. No hernias are noted. Bowel sounds are normal.  EXTREMITIES:  Good range of motion, no edema.   DATA REVIEWED:  I reviewed the pathology reports the original loads the medical oncology and radiation oncology consultation note again.    Alicia Ferguson 02/28/2012  CC: Alicia Cranker, MD, Daisy Floro, MD

## 2012-03-20 NOTE — Preoperative (Signed)
Beta Blockers   Reason not to administer Beta Blockers:Not Applicable 

## 2012-03-20 NOTE — Op Note (Signed)
Alicia Ferguson 1960/09/10 [id number] 02/23/2012  Preoperative diagnosis: left breast cancer, upper inner quadrant, clinical stage I,multiple bilateral inframammary fold skin tags  Postoperative diagnosis: same  Procedure: wire localized left partial mastectomy with blue dye injection and left axillary sentinel lymph node excision; placement of Port-A-Cath with ultrasound and fluoroscopic guidance;excision 2 skin takes from left inframammary fold and 2 skin tags from right inframammary fold  Surgeon: Currie Paris, MD, FACS   Anesthesia: General   Clinical History and Indications: this patient has recently been found to have breast cancer with some associated calcifications over a fairly large area in the upper inner quadrant of the left breast. After discussion of alternatives she has elected a lumpectomy with sentinel node evaluation. Plans to be made for chemotherapy postoperatively so Port-A-Cath placement was also planned today.    Description of Procedure: I saw the patient in the preoperative area and confirmed the plans as noted above. I marked the left breast see upper side. I reviewed the wire localizing films. I marked the location of the port on the right side where the patient wished me to leave the port so it would not interfere with her bra strap.  The patient was taken to the operating room and after satisfactory general anesthesia was obtained the timeout was performed. I injected 5 cc of dilute methylene blue into the left subareolar area and this was massaged in. A full prep and drape of the left breast was done. Using scissors it excised to inframammary fold skin tags.  There were 2 guidewires placed to bracket the cancer. They entered medially and tracking towards the areola and nipple area and were essentially parallel. I made a transverse incision starting just lateral to the more medial of the 2 guidewires and extended into the areola margin. A recent thin skin flaps  and manipulated both guidewires into the wound. I divided breast tissue down to the chest wall just medial to the more medial of 2 guidewires and then down to the chest wall superior and inferior try to get well around the area in question. There appeared to be some residual hematoma around the medial aspect of the areola and I included this in the excision. This was actually palpable. Off I excised the entire area going well lateral to the nipple. I was down to chest wall.  His visceral metastases are again making sure if it was dry. I injected 20 cc of 0.25% plain Marcaine then irrigated again. When everything appeared to be dry I put some clips and to mark the margins and closed in layers with 3-0 Vicryl, 4-0 Monocryl, and finally Dermabond.  Attention was turned to the left axilla. A hot area was verified with the neoprobe a transverse incision made. It abuts C. She is an almost immediately found a small blue lymphatic leading to blue lymph node which was excised and had counts of 2800. I saw 2 other small blue lymphatics leading to other lymph nodes both of which were excised both of which had counts of around 300. There were no other palpably abnormal nodes, no other hot areas, and no other blue lymphatics or blue lymph nodes. I then injected 10 cc of 0. 5% Marcaine plain, irrigated and closed in layers.  The patient was repositioned and reprepped and draped for a Port-A-Cath placement on the right side. She's placed in Trendelenburg position. A new timeout was performed.  2 small skin tags in the right inframammary fold were removed.  I then  made 3 attempts to enter the right subclavian vein but was unable to get any blood return on any attempts. The patient is fairly obese had difficulty in palpating the clavicle for orientation purposes. At this point I used the ultrasound and identified the internal jugular vein. I used a finder needle to access it initially and then entered the vein with the  Port-A-Cath needle and threaded a guidewire under fluoroscopy into the superior vena cava. A transverse incision was made on the anterior chest wall in the area we had marked preoperatively in a pocket fashion. The Port-A-Cath tubing was then brought through subcutaneous tunnel. I had to make a counterincision because of difficulty threading the catheter over the clavicle and through some old scar from a prior thyroidectomy. Then using fluoroscopic control I passed the dilator and peel-away sheath over the guidewire and introduced into the superior vena cava and this went easily. The guidewire and dilator were removed, the catheter threaded through the peel-away sheath which was then removed. Initially through the catheter to about 22 cm. Using fluoroscopy and with some contrast in the catheter at back up to 15 cm where it appeared to be in the distal superior vena cava. The reservoir was attached flushed and worked fine. I placed the reservoir in its pocket and did a final check with fluoroscopy. Everything appeared okay. The reservoir was secured to the deep tissues with 2-0 Prolene sutures. A final aspiration with dilute heparin followed by flushed with dilute and then concentrated with heparin was done. Incision was then closed with 3-0 Vicryl, 4-0 Monocryl septic, and Dermabond.  Currie Paris, MD, FACS 03/20/2012 1:40 PM

## 2012-03-20 NOTE — Anesthesia Postprocedure Evaluation (Signed)
  Anesthesia Post-op Note  Patient: Alicia Ferguson  Procedure(s) Performed: Procedure(s) (LRB) with comments: BREAST LUMPECTOMY WITH NEEDLE LOCALIZATION AND AXILLARY SENTINEL LYMPH NODE BX (Left) - with removal of skin tags from both breasts. INSERTION PORT-A-CATH (Right) - Right internal jugular  Patient Location: PACU  Anesthesia Type:General  Level of Consciousness: sedated, patient cooperative and responds to stimulation and voice  Airway and Oxygen Therapy: Patient Spontanous Breathing and Patient connected to nasal cannula oxygen  Post-op Pain: none  Post-op Assessment: Post-op Vital signs reviewed, Patient's Cardiovascular Status Stable, Respiratory Function Stable, Patent Airway and Pain level controlled, nausea improved  Post-op Vital Signs: Reviewed and stable  Complications: nauseated post op, improved with medication

## 2012-03-20 NOTE — Anesthesia Preprocedure Evaluation (Addendum)
Anesthesia Evaluation  Patient identified by MRN, date of birth, ID band Patient awake    Reviewed: Allergy & Precautions, H&P , NPO status , Patient's Chart, lab work & pertinent test results  History of Anesthesia Complications (+) PONV  Airway Mallampati: II TM Distance: >3 FB Neck ROM: Full    Dental No notable dental hx. (+) Teeth Intact and Dental Advisory Given   Pulmonary sleep apnea and Continuous Positive Airway Pressure Ventilation ,  CHEST - 2 VIEW   Comparison: None   Findings: Cardiomediastinal silhouette is unremarkable.  No acute infiltrate or pleural effusion.  No pulmonary edema. Minimal thoracic dextroscoliosis.  A  metallic fixation plate noted cervical spine.   IMPRESSION: No active disease.  03-12-12 breath sounds clear to auscultation  Pulmonary exam normal       Cardiovascular hypertension, Pt. on medications Rhythm:Regular Rate:Normal  12-Mar-2012 14:42:57 [facility] ROUTINE RECORD Normal sinus rhythm Normal ECG No real change from 2002  11/13 ECHO: normal LVF, EF 60%, valves OK   Neuro/Psych negative neurological ROS  negative psych ROS   GI/Hepatic Neg liver ROS, GERD-  Controlled,  Endo/Other  neg diabetesMorbid obesity  Renal/GU negative Renal ROS     Musculoskeletal   Abdominal (+) + obese,   Peds  Hematology   Anesthesia Other Findings Breast cancer  Reproductive/Obstetrics                         Anesthesia Physical Anesthesia Plan  ASA: III  Anesthesia Plan: General   Post-op Pain Management:    Induction: Intravenous  Airway Management Planned: LMA  Additional Equipment:   Intra-op Plan:   Post-operative Plan:   Informed Consent: I have reviewed the patients History and Physical, chart, labs and discussed the procedure including the risks, benefits and alternatives for the proposed anesthesia with the patient or  authorized representative who has indicated his/her understanding and acceptance.   Dental advisory given  Plan Discussed with: CRNA and Surgeon  Anesthesia Plan Comments: (Plan routine monitors, GA- LMA OK)        Anesthesia Quick Evaluation

## 2012-03-20 NOTE — Progress Notes (Signed)
Spoke with Alicia Ferguson in nuclear medicine. Alicia Ferguson is on their list of patients today.  She was  Advised of pt's arrival.

## 2012-03-20 NOTE — Interval H&P Note (Signed)
History and Physical Interval Note:  03/20/2012 10:08 AM  Alicia Ferguson  has presented today for surgery, with the diagnosis of left breast cancer  The various methods of treatment have been discussed with the patient and family. After consideration of risks, benefits and other options for treatment, the patient has consented to  Procedure(s) (LRB) with comments: BREAST LUMPECTOMY WITH NEEDLE LOCALIZATION AND AXILLARY SENTINEL LYMPH NODE BX (Left) INSERTION PORT-A-CATH (N/A) as a surgical intervention .  The patient's history has been reviewed, patient examined, no change in status, stable for surgery.  I have reviewed the patient's chart and labs.  Questions were answered to the patient's satisfaction.   The patient has decided to do chemo so we will plan to put a port in after the lumpectomy. I have marked the left breast and marked the skin tags she wishes [place] to remove  Hiroshi Krummel J

## 2012-03-20 NOTE — Telephone Encounter (Signed)
Called and left message for patient to call back to obtain post op appointment info. Patient set up to see Dr. Jamey Ripa on 12/20 at 8:30.

## 2012-03-20 NOTE — Anesthesia Procedure Notes (Signed)
Procedure Name: LMA Insertion Date/Time: 03/20/2012 10:58 AM Performed by: Tyrone Nine Pre-anesthesia Checklist: Patient identified, Timeout performed, Emergency Drugs available, Suction available and Patient being monitored Patient Re-evaluated:Patient Re-evaluated prior to inductionOxygen Delivery Method: Circle system utilized Preoxygenation: Pre-oxygenation with 100% oxygen Intubation Type: IV induction Ventilation: Mask ventilation without difficulty LMA: LMA with gastric port inserted LMA Size: 5.0 Number of attempts: 1 Placement Confirmation: breath sounds checked- equal and bilateral and positive ETCO2 Tube secured with: Tape Dental Injury: Teeth and Oropharynx as per pre-operative assessment  Comments: Gauze airway placed b/t pt's teeth and LMA carefullly

## 2012-03-20 NOTE — Transfer of Care (Signed)
Immediate Anesthesia Transfer of Care Note  Patient: Alicia Ferguson  Procedure(s) Performed: Procedure(s) (LRB) with comments: BREAST LUMPECTOMY WITH NEEDLE LOCALIZATION AND AXILLARY SENTINEL LYMPH NODE BX (Left) - with removal of skin tags from both breasts. INSERTION PORT-A-CATH (Right) - Right internal jugular  Patient Location: PACU  Anesthesia Type:General  Level of Consciousness: awake, alert , oriented and patient cooperative  Airway & Oxygen Therapy: Patient Spontanous Breathing and Patient connected to nasal cannula oxygen  Post-op Assessment: Report given to PACU RN and Post -op Vital signs reviewed and stable  Post vital signs: Reviewed and stable  Complications: No apparent anesthesia complications

## 2012-03-21 ENCOUNTER — Encounter (HOSPITAL_COMMUNITY): Payer: Self-pay | Admitting: Surgery

## 2012-03-22 ENCOUNTER — Telehealth (INDEPENDENT_AMBULATORY_CARE_PROVIDER_SITE_OTHER): Payer: Self-pay | Admitting: General Surgery

## 2012-03-22 NOTE — Telephone Encounter (Signed)
Left message on machine for patient to call back and ask for me. To make her aware of path results.

## 2012-03-22 NOTE — Telephone Encounter (Signed)
Message copied by Liliana Cline on Fri Mar 22, 2012  3:33 PM ------      Message from: Currie Paris      Created: Fri Mar 22, 2012  3:29 PM       Tell the patient that her margins are OK and her lymph nodes are negative. I will discuss in detail in the office.

## 2012-03-22 NOTE — Telephone Encounter (Signed)
Spoke with patient and made her aware of pathology results. She wanted to know if she could fly next Wednesday. I advised against this since she did have three lymph nodes removed and it would only be a week from surgery. She also wanted to know if it was normal to feel the tube from her PAC. I made her aware this is normal and she probably has some swelling around it making it more uncomfortable and this should get better. Patient will call with any questions prior to follow up appt.

## 2012-04-04 ENCOUNTER — Other Ambulatory Visit (HOSPITAL_BASED_OUTPATIENT_CLINIC_OR_DEPARTMENT_OTHER): Payer: BC Managed Care – PPO | Admitting: Lab

## 2012-04-04 ENCOUNTER — Ambulatory Visit (HOSPITAL_BASED_OUTPATIENT_CLINIC_OR_DEPARTMENT_OTHER): Payer: BC Managed Care – PPO | Admitting: Oncology

## 2012-04-04 ENCOUNTER — Telehealth: Payer: Self-pay | Admitting: *Deleted

## 2012-04-04 VITALS — BP 140/83 | HR 78 | Temp 98.2°F | Resp 20 | Ht 66.0 in | Wt 275.4 lb

## 2012-04-04 DIAGNOSIS — C50119 Malignant neoplasm of central portion of unspecified female breast: Secondary | ICD-10-CM

## 2012-04-04 DIAGNOSIS — Z171 Estrogen receptor negative status [ER-]: Secondary | ICD-10-CM

## 2012-04-04 DIAGNOSIS — C50919 Malignant neoplasm of unspecified site of unspecified female breast: Secondary | ICD-10-CM

## 2012-04-04 LAB — COMPREHENSIVE METABOLIC PANEL (CC13)
AST: 26 U/L (ref 5–34)
Albumin: 3.4 g/dL — ABNORMAL LOW (ref 3.5–5.0)
BUN: 13 mg/dL (ref 7.0–26.0)
Calcium: 8.9 mg/dL (ref 8.4–10.4)
Chloride: 106 mEq/L (ref 98–107)
Creatinine: 0.8 mg/dL (ref 0.6–1.1)
Glucose: 112 mg/dl — ABNORMAL HIGH (ref 70–99)
Potassium: 4 mEq/L (ref 3.5–5.1)

## 2012-04-04 LAB — CBC WITH DIFFERENTIAL/PLATELET
Basophils Absolute: 0.1 10*3/uL (ref 0.0–0.1)
EOS%: 1.8 % (ref 0.0–7.0)
Eosinophils Absolute: 0.2 10*3/uL (ref 0.0–0.5)
HCT: 36.4 % (ref 34.8–46.6)
HGB: 12.3 g/dL (ref 11.6–15.9)
MONO#: 0.9 10*3/uL (ref 0.1–0.9)
NEUT#: 4.8 10*3/uL (ref 1.5–6.5)
NEUT%: 55.4 % (ref 38.4–76.8)
RDW: 14.6 % — ABNORMAL HIGH (ref 11.2–14.5)
WBC: 8.7 10*3/uL (ref 3.9–10.3)
lymph#: 2.8 10*3/uL (ref 0.9–3.3)

## 2012-04-04 NOTE — Progress Notes (Signed)
ID: Alicia Ferguson   DOB: 1960/11/19  MR#: [id number]  CSN#:[id number]  PCP: Daisy Floro, MD GYN:  SUCicero Duck MD OTHER MD: Etter Sjogren, Marcelyn Bruins   HISTORY OF PRESENT ILLNESS: Alicia Ferguson had bilateral screening mammography at the [facility] 12/01/2011. There were some calcifications in the left breast and a possible mass in the right breast, leading to diagnostic mammography 01/29/2012. There were 2 areas of calcification in the left breast, without associated mass or distortion. In the right breast there was a 3 cm oval area of increased density. Neither of these abnormalities were palpable. Ultrasound was noninformative. On the same day, biopsy of one of the left breast lesions was obtained, and showed ([id number]) invasive ductal carcinoma, grade 1, estrogen and progesterone receptor negative, with an MIB-1 of 51%, and an equivocal HER-2 at 2.02  On 02/03/2012 the patient underwent bilateral breast MRI. In the left breast there was an irregular enhancing mass measuring 2.5 cm, which is the one that had been biopsied. In additional, there was a 7 mm mass anterior to the previously biopsied mass and a third mass in the lower outer quadrant of the left breast. The right breast was unremarkable, and there was no axillary or internal mammary adenopathy noted.  Biopsy of the lateral breast mass on 02/12/2012 showed only fibrocystic changes. ([id number]). The patient's subsequent history is as detailed below.  INTERVAL HISTORY: Alicia Ferguson returns today for followup of her breast cancer. Her situation is unusual. We expect that she would have a T2 or at least T1C invasive tumor on her final pathology, but the invasive portion turned out to be 0.28 cm. With a T1 A. tumor, the standard of care per NCCN is endocrine treatment if estrogen receptor positive. Of course she is estrogen receptor negative. This was discussed at the multidisciplinary breast conference 04/03/2012 and a decision was made for  observation alone.  REVIEW OF SYSTEMS: She did well with her lumpectomy, without unusual bleeding, fever, or erythema. She did have moderate pain, which has resolved. She has experienced some of the fleeting stabbing/stinging pains that patient's describe even years later involving the surgical breast. She understands that is due to "nerve damage" and does not indicate residual cancer or infection. She notes some dimpling in the surgical breast. She is not sleeping as well since her surgery, and does admit to some anxiety, but no depression. She is having some discomfort from her port, particularly in the upper area, although she denies any erythema there. A detailed review of systems was otherwise noncontributory.  PAST MEDICAL HISTORY: Past Medical History  Diagnosis Date  . Breast cancer   . PONV (postoperative nausea and vomiting)   . Hypertension     takes Ramipril daily  . Neck pain     buldging  . History of colon polyps   . Urinary frequency   . History of kidney stones   . Vitamin D deficiency     takes Oscal every other day    PAST SURGICAL HISTORY: Past Surgical History  Procedure Date  . Cholecystectomy 2000  . Neck surgery 2001  . Tubal ligation 1998  . Back surgery 1984  . Ovarian cysts removed   . Blephoplasty   . Colonoscopy   . Breast lumpectomy with needle localization and axillary sentinel lymph node bx 03/20/2012    Procedure: BREAST LUMPECTOMY WITH NEEDLE LOCALIZATION AND AXILLARY SENTINEL LYMPH NODE BX;  Surgeon: Currie Paris, MD;  Location: [facility] OR;  Service: General;  Laterality: Left;  with removal of skin tags from both breasts.  . Portacath placement 03/20/2012    Procedure: INSERTION PORT-A-CATH;  Surgeon: Currie Paris, MD;  Location: [facility] OR;  Service: General;  Laterality: Right;  Right internal jugular    FAMILY HISTORY Family History  Problem Relation Age of Onset  . Allergies Mother   . Asthma Mother   . Colon cancer Maternal  Grandmother   . Prostate cancer Brother 35  the patient's father died at the age of 87 from a ruptured brain aneurysm. The patient's mother died of "old age" at 56. The patient has one brother and one sister. The patient's maternal grandmother was diagnosed with colon cancer in her 72s. There is no history of breast or ovarian cancer in the family.  GYNECOLOGIC HISTORY: Menarche age 23, first live birth age 3. Menopause 2006. She is GX P2. 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 She is divorced, and lives alone, with no pets. Her son Alicia Ferguson 72 years old is studying Tourist information centre manager in [place]. Her daughter Alicia Ferguson 77 is studying Special educational needs teacher in [place]   ADVANCED DIRECTIVES: in place; the patient has named her sister, Osvaldo Shipper, as her healthcare power of attorney. She can be reached at [phone]  HEALTH MAINTENANCE: History  Substance Use Topics  . Smoking status: Never Smoker   . Smokeless tobacco: Never Used  . Alcohol Use: Yes     Comment: rarely     Colonoscopy:  PAP:  Bone density:  Lipid panel:  No Known Allergies  Current Outpatient Prescriptions  Medication Sig Dispense Refill  . calcium-vitamin D (OSCAL) 250-125 MG-UNIT per tablet Take 1 tablet by mouth every other day.      . ibuprofen (ADVIL,MOTRIN) 200 MG tablet Take 800 mg by mouth as needed. For pain      . oxyCODONE-acetaminophen (ROXICET) 5-325 MG per tablet Take 1 tablet by mouth every 4 (four) hours as needed for pain.  30 tablet  0  . ramipril (ALTACE) 10 MG capsule Take 2 capsules by mouth daily.        OBJECTIVE: Middle-aged white woman in no acute distress Filed Vitals:   04/04/12 1417  BP: 140/83  Pulse: 78  Temp: 98.2 F (36.8 C)  Resp: 20     Body mass index is 44.45 kg/(m^2).    ECOG FS: 0  Sclerae unicteric Oropharynx clear No cervical or supraclavicular adenopathy Lungs no rales or rhonchi Heart  regular rate and rhythm Abd benign MSK no focal spinal tenderness, no peripheral edema Neuro: nonfocal Breasts: The right breast is unremarkable. The left breast is status post lumpectomy. The cosmetic result is good. The incision is healing nicely. There is no dehiscence, swelling, or erythema. The left axilla is benign.   LAB RESULTS: Lab Results  Component Value Date   WBC 8.7 04/04/2012   NEUTROABS 4.8 04/04/2012   HGB 12.3 04/04/2012   HCT 36.4 04/04/2012   MCV 83.2 04/04/2012   PLT 345 04/04/2012      Chemistry      Component Value Date/Time   NA 139 03/12/2012 1431   NA 141 02/14/2012 1242   K 3.8 03/12/2012 1431   K 4.0 02/14/2012 1242   CL 101 03/12/2012 1431   CL 108* 02/14/2012 1242   CO2 30 03/12/2012 1431   CO2 28 02/14/2012 1242   BUN 14 03/12/2012 1431   BUN 14.0 02/14/2012 1242  CREATININE 0.80 03/12/2012 1431   CREATININE 0.8 02/14/2012 1242      Component Value Date/Time   CALCIUM 9.2 03/12/2012 1431   CALCIUM 9.0 02/14/2012 1242   ALKPHOS 58 02/14/2012 1242   AST 23 02/14/2012 1242   ALT 38 02/14/2012 1242   BILITOT 0.71 02/14/2012 1242       Lab Results  Component Value Date   LABCA2 13 02/14/2012    No components found with this basename: [id number]    No results found for this basename: INR:1;PROTIME:1 in the last 168 hours  Urinalysis No results found for this basename: colorurine,  appearanceur,  labspec,  phurine,  glucoseu,  hgbur,  bilirubinur,  ketonesur,  proteinur,  urobilinogen,  nitrite,  leukocytesur    STUDIES: US Breast Left  02/12/2012  *RADIOLOGY REPORT*  Clinical Data:  Biopsy-proven left breast DCIS.  Two abnormalities seen on recent MRI.  LEFT BREAST ULTRASOUND  Comparison:  With priors  On physical exam, I do not palpate a discrete mass in the left breast.  Findings: Ultrasound is performed, showing there is a hypoechoic lesion in the left breast at 4 o'clock 8 cm from the nipple measuring 4 x 2 x 4 mm.  This  corresponds well with the mass seen with MR imaging.  IMPRESSION: Suspicious left breast mass.  RECOMMENDATION: Ultrasound guided core biopsy of the left breast is recommended. This will be performed and dictated separately.  I have discussed the findings and recommendations with the patient. Results were also provided in writing at the conclusion of the visit.  BI-RADS CATEGORY 4:  Suspicious abnormality - biopsy should be considered.   Original Report Authenticated By: Littie Deeds. Judyann Munson, M.D.    US Breast Right  01/29/2012  *RADIOLOGY REPORT*  Clinical Data:  Abnormal screening mammogram with possible right breast mass and left breast calcifications.  DIGITAL DIAGNOSTIC BILATERAL MAMMOGRAM  AND RIGHT BREAST ULTRASOUND:  Comparison:  12/01/2011 mammograms.  Findings:  Magnification views of the left breast demonstrate heterogeneous and pleomorphic calcifications within the inner central left breast spanning a 3 x 4 cm area.  A 1 mm cluster of calcifications further within the medial left breast are identified 3 cm medial to the larger cluster.  There is no evidence of distortion or associated mass.  Spot compression views of the right breast demonstrate a 2 x 3 cm circumscribed oval area of increased density with probable internal fat.  On physical exam, no palpable abnormalities identified within the upper or outer right breast.  Ultrasound is performed, showing no evidence of solid or cystic mass, distortion or abnormal areas of shadowing within the upper or outer right breast. No abnormalities identified within the inner left breast, in the area of the suspicious calcifications.  IMPRESSION: Suspicious left breast calcifications - stereotactic biopsy is recommended.  This was discussed with the patient and she desires to proceed to stereotactic guided left breast biopsy which will be performed today.  Focal oval density within the upper outer right breast without sonographic correlate.  This probably represents  normal fibroglandular tissue or hamartoma.  65-month follow-up is recommended to ensure stability.  BI-RADS CATEGORY 4:  Suspicious abnormality - biopsy should be considered.  RECOMMENDATION: Stereotactic guided biopsy of the left breast, which will be performed today but dictated in a separate report.  Right diagnostic mammogram with possible right breast ultrasound in 6 months to follow up likely benign right breast finding.  If the left breast calcifications prove to be neoplastic and MRI is  performed, then this area would be better evaluated with that MRI.   Original Report Authenticated By: Rosendo Gros, M.D.    Mr Breast Bilateral W Wo Contrast  02/05/2012  *RADIOLOGY REPORT*  Clinical Data: New diagnosis invasive ductal carcinoma in situ, grade 1.  BILATERAL BREAST MRI WITH AND WITHOUT CONTRAST  Technique: Multiplanar, multisequence MR images of both breasts were obtained prior to and following the intravenous administration of 20ml of Multihance.  Three dimensional images were evaluated at the independent DynaCad workstation.  Comparison:  None.  Findings: Parenchymal enhancement seen bilaterally, right greater than left breast. An irregular enhancing mass with ill-defined margins is seen in the medial central aspect of the left breast, middle third measuring 2.5 x 1.0 x 0.9 cm with biopsy clip artifact corresponding to the area of known malignancy.  Additionally, clumped and linear enhancement is seen in the medial central aspect of the left breast, anteriorly measuring 0.7 x 0.6 x 0.6 cm and a round, enhancing mass with ill-defined margins is seen in the lower outer quadrant of the left breast, middle third measuring 0.6 x 0.6 x 0.7 cm.  No suspicious mass or enhancement seen in the right breast. No axillary or internal mammary adenopathy is seen.  Impression: Known malignancy, left breast.  Two additional areas of enhancement are seen in the left breast which biopsy is recommended. No MRI specific  evidence of malignancy, right breast.  RECOMMENDATION: Second look ultrasound is recommended for the mass in the lower outer quadrant of the left breast.  MRI guided biopsy for enhancment in the medial central left breast anteriorly.  THREE-DIMENSIONAL MR IMAGE RENDERING ON INDEPENDENT WORKSTATION:  Three-dimensional MR images were rendered by post-processing of the original MR data on an independent workstation.  The three- dimensional MR images were interpreted, and findings were reported in the accompanying complete MRI report for this study.  BI-RADS CATEGORY 6:  Known biopsy-proven malignancy - appropriate action should be taken.   Original Report Authenticated By: Hiram Gash, M.D.    [place] Core Biopsy  02/13/2012  **ADDENDUM** CREATED: 02/13/2012 13:04:55  Histologic evaluation demonstrates benign fibrocystic change. There is no evidence of malignancy.  This is concordant.  The patient is scheduled to be seen in the [facility] on 02/14/2012.  If MRI guided biopsy of the second area noted on recent MRI anterior to the positive biopsy site is needed, this can be scheduled.  Results were discussed with the patient by telephone at her request.  She report discomfort at the biopsy site but no other complications.  **END ADDENDUM** SIGNED BY: Cain Saupe, M.D.   02/12/2012  *RADIOLOGY REPORT*  Clinical Data:  Biopsy-proven left breast DCIS.  Suspicious nodule seen on MR imaging and visualized sonographically.  ULTRASOUND GUIDED VACUUM ASSISTED CORE BIOPSY OF THE LEFT BREAST  Comparison: With priors  I met with the patient and we discussed the procedure of ultrasound- guided biopsy, including benefits and alternatives.  We discussed the high likelihood of a successful procedure. We discussed the risks of the procedure including infection, bleeding, tissue injury, clip migration, and inadequate sampling.  Informed written consent was given.  Using sterile technique,  2% lidocaine ultrasound guidance and a 12 gauge vacuum assisted needle biopsy was performed of a nodule in the 4 o'clock region of the left breast using a lateral approach. At the conclusion of the procedure, a ribbon shaped tissue marker clip was deployed into the biopsy cavity.  Follow-up 2-view mammogram was  performed and dictated separately.  IMPRESSION: Ultrasound-guided biopsy of the left breast.  No apparent complications.   Original Report Authenticated By: Daryl Eastern, M.D.    Mm Breast Stereo Biopsy Left  01/29/2012  *RADIOLOGY REPORT*  Clinical Data:  51 year old female with suspicious left breast calcifications - for tissue sampling.  STEREOTACTIC-GUIDED VACUUM ASSISTED BIOPSY OF THE LEFT BREAST AND SPECIMEN RADIOGRAPH  Comparison: Previous exams.  I met with the patient and we discussed the procedure of stereotactic-guided biopsy, including benefits and alternatives. We discussed the high likelihood of a successful procedure. We discussed the risks of the procedure, including infection, bleeding, tissue injury, clip migration, and inadequate sampling. Informed, written consent was given.  Using sterile technique, 2% lidocaine, stereotactic guidance, and a 9 gauge vacuum assisted device, biopsy was performed of the heterogeneous and slightly pleomorphic calcifications within the inner central left breast using a superior approach.  Specimen radiograph was performed, showing calcifications.  Specimens with calcifications are identified for pathology.  At the conclusion of the procedure, a T shaped tissue marker clip was deployed into the biopsy cavity.  Follow-up 2-view mammogram confirmed clip placement to be satisfactory.  IMPRESSION: Stereotactic-guided biopsy of left breast calcifications.  No apparent complications.  Pathology will be followed.  The patient has an appointment to return to the [facility] on 01/30/2012 to receive pathology results and evaluate her biopsy site.   Original  Report Authenticated By: Rosendo Gros, M.D.    Mm Breast Surgical Specimen  01/29/2012  *RADIOLOGY REPORT*  Clinical Data:  51 year old female with suspicious left breast calcifications - for tissue sampling.  STEREOTACTIC-GUIDED VACUUM ASSISTED BIOPSY OF THE LEFT BREAST AND SPECIMEN RADIOGRAPH  Comparison: Previous exams.  I met with the patient and we discussed the procedure of stereotactic-guided biopsy, including benefits and alternatives. We discussed the high likelihood of a successful procedure. We discussed the risks of the procedure, including infection, bleeding, tissue injury, clip migration, and inadequate sampling. Informed, written consent was given.  Using sterile technique, 2% lidocaine, stereotactic guidance, and a 9 gauge vacuum assisted device, biopsy was performed of the heterogeneous and slightly pleomorphic calcifications within the inner central left breast using a superior approach.  Specimen radiograph was performed, showing calcifications.  Specimens with calcifications are identified for pathology.  At the conclusion of the procedure, a T shaped tissue marker clip was deployed into the biopsy cavity.  Follow-up 2-view mammogram confirmed clip placement to be satisfactory.  IMPRESSION: Stereotactic-guided biopsy of left breast calcifications.  No apparent complications.  Pathology will be followed.  The patient has an appointment to return to the [facility] on 01/30/2012 to receive pathology results and evaluate her biopsy site.   Original Report Authenticated By: Rosendo Gros, M.D.    Mm Digital Diagnostic Bilat Ltd  01/29/2012  *RADIOLOGY REPORT*  Clinical Data:  Abnormal screening mammogram with possible right breast mass and left breast calcifications.  DIGITAL DIAGNOSTIC BILATERAL MAMMOGRAM  AND RIGHT BREAST ULTRASOUND:  Comparison:  12/01/2011 mammograms.  Findings:  Magnification views of the left breast demonstrate heterogeneous and pleomorphic calcifications within the  inner central left breast spanning a 3 x 4 cm area.  A 1 mm cluster of calcifications further within the medial left breast are identified 3 cm medial to the larger cluster.  There is no evidence of distortion or associated mass.  Spot compression views of the right breast demonstrate a 2 x 3 cm circumscribed oval area of increased density with probable internal fat.  On  physical exam, no palpable abnormalities identified within the upper or outer right breast.  Ultrasound is performed, showing no evidence of solid or cystic mass, distortion or abnormal areas of shadowing within the upper or outer right breast. No abnormalities identified within the inner left breast, in the area of the suspicious calcifications.  IMPRESSION: Suspicious left breast calcifications - stereotactic biopsy is recommended.  This was discussed with the patient and she desires to proceed to stereotactic guided left breast biopsy which will be performed today.  Focal oval density within the upper outer right breast without sonographic correlate.  This probably represents normal fibroglandular tissue or hamartoma.  38-month follow-up is recommended to ensure stability.  BI-RADS CATEGORY 4:  Suspicious abnormality - biopsy should be considered.  RECOMMENDATION: Stereotactic guided biopsy of the left breast, which will be performed today but dictated in a separate report.  Right diagnostic mammogram with possible right breast ultrasound in 6 months to follow up likely benign right breast finding.  If the left breast calcifications prove to be neoplastic and MRI is performed, then this area would be better evaluated with that MRI.   Original Report Authenticated By: Rosendo Gros, M.D.    Mm Digital Diagnostic Unilat L  02/12/2012  *RADIOLOGY REPORT*  Clinical Data:  Biopsy-proven left breast DCIS.  Nodules seen in the lower outer quadrant on MR imaging.  DIGITAL DIAGNOSTIC LEFT MAMMOGRAM  Comparison:  With priors  Findings:  Films are  performed following ultrasound guided biopsy of the 4 o'clock region of the left breast.  Mammographic images demonstrate there is a ribbon shaped InRad clip in the lower outer quadrant of the left breast.  IMPRESSION: Status post ultrasound-guided core biopsy of the left breast with pathology pending.   Original Report Authenticated By: Littie Deeds. ARCEO, M.D.    Mm Radiologist Eval And Mgmt  01/30/2012  *RADIOLOGY REPORT*  ESTABLISHED PATIENT OFFICE VISIT - LEVEL II [phone])  Chief Complaint:  The patient returns for discussion of the pathologic findings after stereotactic core needle biopsy of calcifications in the medial aspect of the left breast.  History:  The patient underwent stereotactic core needle biopsy of calcifications in the medial portion of the right breast on 01/29/2012.  Exam:  Biopsy site in the left upper inner quadrant is clean and dry with no sign of hematoma or infection.  Pathology: Histologic evaluation demonstrates invasive and in situ ductal carcinoma.  The invasive carcinoma is grade 1. Calcifications are present.  Assessment and Plan:  Results were discussed with the patient. Questions were answered.  She was given Transport planner. Breast MRI was scheduled for 02/03/2012.  The patient is scheduled to be seen in the [facility] on 02/07/2012.   Original Report Authenticated By: Daryl Eastern, M.D.   Specimen Gross and  ASSESSMENT: 51 y.o. BRCA negative [place] woman status post left lumpectomy 03/20/2012 for a pT1a pN0 invasive ductal carcinoma, grade 2, estrogen and progesterone receptor negative, HER-2 positive with a ratio of 2.81 by [facility], with an MIB-1 of 69%.  (1) biopsy 02/19/2012 of two additional lesions in the Left breast,  benign  PLAN: I discussed with Selena Batten that we had placed a port in the expectation she would need chemotherapy for her T2 invasive cancer, but she turns out to have a T1a tumor, estrogen receptor negative,  and her NCCN guidelines the standard treatment for a T1a invasive tumor that is estrogen and progesterone receptor negative is observation alone, even if they are  HER-2 positive. Accordingly she does not need chemotherapy and also does not need a port.  She was actually delighted at not needing chemotherapy and at being able to remove her port now, since she has had some discomfort in the neck area from this. She is now ready for radiation, and is scheduled to meet with Dr. Karoline Caldwell early January to set this up. I have canceled her chemotherapy and trastuzumab orders. She will return to see [place] in April, by which time she should have recovered from her radiation, and at that time we will discuss long-term followup. She knows to call for any problems that may develop before the next visit. Deeya Richeson C    04/04/2012

## 2012-04-04 NOTE — Telephone Encounter (Signed)
Gave patient appointment for [phone] with amy berry

## 2012-04-05 ENCOUNTER — Ambulatory Visit (INDEPENDENT_AMBULATORY_CARE_PROVIDER_SITE_OTHER): Payer: BC Managed Care – PPO | Admitting: Surgery

## 2012-04-05 ENCOUNTER — Encounter (INDEPENDENT_AMBULATORY_CARE_PROVIDER_SITE_OTHER): Payer: Self-pay | Admitting: Surgery

## 2012-04-05 VITALS — BP 128/70 | HR 80 | Temp 97.8°F | Resp 16 | Ht 66.5 in | Wt 274.0 lb

## 2012-04-05 DIAGNOSIS — Z09 Encounter for follow-up examination after completed treatment for conditions other than malignant neoplasm: Secondary | ICD-10-CM

## 2012-04-05 NOTE — Progress Notes (Signed)
Alicia Ferguson    [id number] 04/05/2012    August 09, 1960   CC: Post op lumpectomy and sln  HPI: The patient returns for post op follow-up. She underwent a left lumpectomy and sln, portacath placement and removal of skin tgags on 03/20/12. Over all she feels that she is doing well. She now will not need chemo, per Dr Darnelle Catalan  PE: VITAL SIGNS: BP 128/70  Pulse 80  Temp 97.8 F (36.6 C) (Temporal)  Resp 16  Ht 5' 6.5" (1.689 m)  Wt 274 lb (124.286 kg)  BMI 43.56 kg/m2  The incisions are healing nicely and there is no evidence of infection or hematoma.    DATA REVIEWED: Pathology report showed  ADDITIONAL INFORMATION: 2. PROGNOSTIC INDICATORS - ACIS Results IMMUNOHISTOCHEMICAL AND MORPHOMETRIC ANALYSIS BY THE AUTOMATED CELLULAR IMAGING SYSTEM (ACIS) Estrogen Receptor (Negative, <1%): 0%, NEGATIVE Progesterone Receptor (Negative, <1%): 0%, NEGATIVE Proliferation Marker Ki67 by M IB-1 (Low<20%): 69% COMMENT: The negative hormone receptor study(ies) in this case have an internal positive control. All controls stained appropriately Pecola Leisure MD Pathologist, Electronic Signature ( Signed 03/27/2012) 2. CHROMOGENIC IN-SITU HYBRIDIZATION Interpretation: HER2/NEU BY CISH - SHOWS AMPLIFICATION BY CISH ANALYSIS. THE RATIO OF HER2: CEP 17 SIGNALS WAS 2.81 Reference range: Ratio: HER2:CEP17 < 1.8 gene amplification not observed Ratio: HER2:CEP 17 1.8-2.2 - equivocal result Ratio: HER2:CEP17 > 2.2 - gene amplification observed 1 of 4 FINAL for Alicia Ferguson, Alicia Ferguson ([id number]) ADDITIONAL INFORMATION:(continued) Pecola Leisure MD Pathologist, Electronic Signature ( Signed 03/27/2012) FINAL DIAGNOSIS Diagnosis 1. Skin , Left inframammary fold - ACROCHORDON. - DERMAL NEVUS. - SEE COMMENT. 2. 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 - TWO BIOPSY SITES. - THE SURGICAL RESECTION  MARGINS ARE NEGATIVE FOR CARCINOMA. - SEE ONCOLOGY TABLE BELOW. 3. Lymph node, sentinel, biopsy, Left axillary - THERE IS NO EVIDENCE OF CARCINOMA IN 1 OF 1 LYMPH NODE (0/1). 4. Lymph node, sentinel, biopsy, Left axillary - THERE IS NO EVIDENCE OF CARCINOMA IN 1 OF 1 LYMPH NODE (0/1). 5. Lymph node, sentinel, biopsy, Left axillary - THERE IS NO EVIDENCE OF CARCINOMA IN 1 OF 1 LYMPH NODE (0/1). 6. Skin , Right inframammary - ACROCHORDONS.  IMPRESSION: Patient doing well.   PLAN: We will schedule port removal.She already had a copy of the path

## 2012-04-05 NOTE — Patient Instructions (Signed)
We will schedule portacath removal t be done under local anesthesia

## 2012-04-08 ENCOUNTER — Encounter: Payer: Self-pay | Admitting: Radiation Oncology

## 2012-04-08 ENCOUNTER — Telehealth: Payer: Self-pay | Admitting: *Deleted

## 2012-04-08 ENCOUNTER — Other Ambulatory Visit: Payer: Self-pay | Admitting: Oncology

## 2012-04-08 NOTE — Progress Notes (Signed)
The patient was discussed at our multidisciplinary tumor board last week. She will not need chemotherapy. However, radiology has recommended a postoperative mammogram. I will see her in early January for followup to discuss starting radiotherapy, and get her scheduled for mammography then.  -----------------------------------  Lonie Peak, MD

## 2012-04-08 NOTE — Telephone Encounter (Signed)
per orders from 04-07-2012 cancelled all appointments but 07-2012 appointments

## 2012-04-08 NOTE — Progress Notes (Signed)
I asked pathology to review the initial biopsy to make sure there was not a contiguous tumor length greater than 5 mm. In fact the longest tumor length was 2 mm. The correct staging therefore is indeed T1a and again observation is the appropriate treatment.

## 2012-04-16 NOTE — Progress Notes (Signed)
preop instructions given

## 2012-04-18 ENCOUNTER — Ambulatory Visit: Payer: BC Managed Care – PPO

## 2012-04-19 ENCOUNTER — Ambulatory Visit: Payer: BC Managed Care – PPO

## 2012-04-23 ENCOUNTER — Ambulatory Visit (HOSPITAL_BASED_OUTPATIENT_CLINIC_OR_DEPARTMENT_OTHER)
Admission: RE | Admit: 2012-04-23 | Discharge: 2012-04-23 | Disposition: A | Discharge status: Home / Self Care | Payer: BC Managed Care – PPO | Source: Ambulatory Visit | Attending: Surgery | Admitting: Surgery

## 2012-04-23 ENCOUNTER — Encounter (HOSPITAL_BASED_OUTPATIENT_CLINIC_OR_DEPARTMENT_OTHER)
Admission: RE | Disposition: A | Discharge status: Home / Self Care | Payer: Self-pay | Source: Ambulatory Visit | Attending: Surgery

## 2012-04-23 ENCOUNTER — Encounter: Payer: Self-pay | Admitting: Radiation Oncology

## 2012-04-23 DIAGNOSIS — Z452 Encounter for adjustment and management of vascular access device: Secondary | ICD-10-CM

## 2012-04-23 HISTORY — PX: PORT-A-CATH REMOVAL: SHX5289

## 2012-04-23 SURGERY — MINOR REMOVAL PORT-A-CATH
Anesthesia: LOCAL | Site: Chest | Wound class: Clean

## 2012-04-23 MED ORDER — LIDOCAINE-EPINEPHRINE (PF) 1 %-1:200000 IJ SOLN
INTRAMUSCULAR | Status: DC | PRN
  Administered 2012-04-23: 10 mL via INTRADERMAL

## 2012-04-23 SURGICAL SUPPLY — 28 items
BENZOIN TINCTURE PRP APPL 2/3 (GAUZE/BANDAGES/DRESSINGS) IMPLANT
BLADE SURG 15 STRL LF DISP TIS (BLADE) ×1 IMPLANT
BLADE SURG 15 STRL SS (BLADE) ×1
CHLORAPREP W/TINT 26ML (MISCELLANEOUS) ×2 IMPLANT
CLOTH BEACON ORANGE TIMEOUT ST (SAFETY) ×2 IMPLANT
DERMABOND ADVANCED (GAUZE/BANDAGES/DRESSINGS) ×1
DERMABOND ADVANCED .7 DNX12 (GAUZE/BANDAGES/DRESSINGS) ×1 IMPLANT
DRSG TEGADERM 4X4.75 (GAUZE/BANDAGES/DRESSINGS) IMPLANT
ELECT REM PT RETURN 9FT ADLT (ELECTROSURGICAL)
ELECTRODE REM PT RTRN 9FT ADLT (ELECTROSURGICAL) IMPLANT
GAUZE SPONGE 4X4 12PLY STRL LF (GAUZE/BANDAGES/DRESSINGS) IMPLANT
GAUZE SPONGE 4X4 16PLY XRAY LF (GAUZE/BANDAGES/DRESSINGS) IMPLANT
GLOVE BIOGEL PI IND STRL 7.5 (GLOVE) ×2 IMPLANT
GLOVE BIOGEL PI INDICATOR 7.5 (GLOVE) ×2
GLOVE EUDERMIC 7 POWDERFREE (GLOVE) ×2 IMPLANT
MARKER SKIN DUAL TIP RULER LAB (MISCELLANEOUS) ×2 IMPLANT
NDL SAFETY ECLIPSE 18X1.5 (NEEDLE) IMPLANT
NEEDLE HYPO 18GX1.5 SHARP (NEEDLE)
NEEDLE HYPO 25X1 1.5 SAFETY (NEEDLE) ×2 IMPLANT
PENCIL BUTTON HOLSTER BLD 10FT (ELECTRODE) IMPLANT
STRIP CLOSURE SKIN 1/2X4 (GAUZE/BANDAGES/DRESSINGS) IMPLANT
SUT MNCRL AB 4-0 PS2 18 (SUTURE) ×2 IMPLANT
SUT VIC AB 3-0 FS2 27 (SUTURE) IMPLANT
SUT VIC AB 4-0 BRD 54 (SUTURE) IMPLANT
SUT VIC AB 4-0 P-3 18XBRD (SUTURE) IMPLANT
SUT VIC AB 4-0 P3 18 (SUTURE)
SUT VIC AB 4-0 SH 18 (SUTURE) ×2 IMPLANT
SYR CONTROL 10ML LL (SYRINGE) ×2 IMPLANT

## 2012-04-23 NOTE — Op Note (Signed)
Alicia Ferguson Dec 19, 1960 [id number] 04/05/2012  Preoperative diagnosis: Un-Needed PAC  Postoperative diagnosis: Same  Procedure: Portacath Removal  Surgeon: Currie Paris, MD, FACS  Anesthesia:Local   Clinical History and Indications: The patient  no longer needs a port. She wishes to have it removed.  Procedure: The patient was seen in the preoperative area and we confirmed the plans for the procedure as noted above. The Port-A-Cath site was identified and marked. The patient had no further questions.  The patient was then taken into the procedure room. The timeout was done. The area over the Port-A-Cath was anesthetized with 1% Xylocaine with epinephrine. I waited about 10 minutes and then the area was prepped and draped.  The old scar was opened. The capsule around the port opened and the port identified. The holding sutures were cut. The catheter was backed partially out of its tract. A figure 8 3-0 Vicryl suture was placed, the tubing removed, and the suture tied down to prevent backbleeding.  The port was then removed from its pocket. I made sure everything was dry. The incision was closed with 3-0 Vicryl, 4-0 Monocryl subcuticular, and Dermabond.  The patient tolerated the procedure well. There were no complications.  Currie Paris, MD, FACS 04/23/2012 8:02 AM

## 2012-04-23 NOTE — H&P (Signed)
Patient presents for removal of portacath under local.It was determined after surgery that she does not need chemo

## 2012-04-24 ENCOUNTER — Ambulatory Visit
Admission: RE | Admit: 2012-04-24 | Discharge: 2012-04-24 | Disposition: A | Discharge status: Home / Self Care | Payer: BC Managed Care – PPO | Source: Ambulatory Visit | Attending: Radiation Oncology | Admitting: Radiation Oncology

## 2012-04-24 ENCOUNTER — Encounter: Payer: Self-pay | Admitting: Radiation Oncology

## 2012-04-24 VITALS — BP 139/84 | HR 85 | Temp 97.7°F | Resp 20 | Wt 276.3 lb

## 2012-04-24 DIAGNOSIS — C50119 Malignant neoplasm of central portion of unspecified female breast: Secondary | ICD-10-CM

## 2012-04-24 NOTE — Progress Notes (Signed)
Here for consult regarding radiation therapy for cancer of the left breast.

## 2012-04-24 NOTE — Addendum Note (Signed)
Encounter addended by: Lonie Peak, MD on: 04/24/2012 12:02 PM<BR>     Documentation filed: Inpatient Notes, Notes Section

## 2012-04-24 NOTE — Progress Notes (Signed)
  Radiation Oncology         [phone] ________________________________  Name: Alicia Ferguson MRN: [id number]  Date: 04/24/2012  DOB: [birth year]  Follow-Up Visit Note  Outpatient  CC: Daisy Floro, MD    Diagnosis: [id number] ER/PR- Her2 + breast cancer, left central breast  Narrative:  The patient returns today for routine follow-up.  Pathologically, her disease ended up being a  lower stage   than her original T2 N0 clinical status. It was confirmed with the pathology that the longest tumor length was 2.8 mm. Her margins are negative by at least 2 mm to invasive and in situ disease. The patient's Port-A-Cath was removed as medical oncology no longer recommends chemotherapy. She has recovered well from her lumpectomy which had been performed on 03/20/2012. Her sentinel lymph nodes were all negative.  She is otherwise in her usual state of health.                     ALLERGIES:   has no known allergies.  Meds: Current Outpatient Prescriptions  Medication Sig Dispense Refill  . calcium-vitamin D (OSCAL) 250-125 MG-UNIT per tablet Take 1 tablet by mouth every other day.      . ibuprofen (ADVIL,MOTRIN) 200 MG tablet Take 800 mg by mouth as needed. For pain      . ramipril (ALTACE) 10 MG capsule Take 2 capsules by mouth daily.        Physical Findings: The patient is in no acute distress. Patient is alert and oriented.  weight is 276 lb 4.8 oz (125.329 kg). Her temperature is 97.7 F (36.5 C). Her blood pressure is 139/84 and her pulse is 85. Her respiration is 20. Marland Kitchen  No significant changes. Left breast has healed well from surgery. The lumpectomy scar is in the medial breast with no sign of infection. Modest seroma underneath the scar. Axillary scar unremarkable   Lab Findings: Lab Results  Component Value Date   WBC 8.7 04/04/2012   HGB 12.3 04/04/2012   HCT 36.4 04/04/2012   MCV 83.2 04/04/2012   PLT 345 04/04/2012     Radiographic Findings: No results  found.  Impression/Plan:   She has healed adequately for treatment planning but the recommendation from the radiologist at our multidisciplinary tumor Board is that she should first undergo postoperative mammogram to rule out residual suspicious calcifications. This mammogram has been scheduled for 04/30/2012. The patient has been scheduled for CT simulation to follow the mammogram, on 05/03/2012.  Once again, we discussed the risks, benefits, and side effects of radiotherapy to the whole left breast. We discussed that radiation would take approximately 6 to 7 weeks to complete. We spoke about acute effects including skin irritation, fatigue as well as much less common late effects including lung and heart irritation. We spoke about long-term cosmetic changes to the left breast. We spoke about the latest technology that is used to minimize the risk of late effects for breast cancer patients undergoing radiotherapy, such as the breath-hold technique to protect the heart. No guarantees of treatment were given. A consent form has been signed and put in her chart. The patient is enthusiastic about proceeding with treatment. I look forward to participating in the patient's care.  30 minutes was spent on this encounter, face to face time. Over 50% spent in counseling and coordination of care  _____________________________________   Lonie Peak, MD

## 2012-04-25 ENCOUNTER — Encounter (HOSPITAL_BASED_OUTPATIENT_CLINIC_OR_DEPARTMENT_OTHER): Payer: Self-pay | Admitting: Surgery

## 2012-04-25 NOTE — Addendum Note (Signed)
Encounter addended by: Delynn Flavin, RN on: 04/25/2012  6:03 PM<BR>     Documentation filed: Charges VN

## 2012-04-30 ENCOUNTER — Other Ambulatory Visit: Payer: Self-pay | Admitting: Radiation Oncology

## 2012-04-30 ENCOUNTER — Ambulatory Visit
Admission: RE | Admit: 2012-04-30 | Discharge: 2012-04-30 | Disposition: A | Discharge status: Home / Self Care | Payer: BC Managed Care – PPO | Source: Ambulatory Visit | Attending: Radiation Oncology | Admitting: Radiation Oncology

## 2012-04-30 DIAGNOSIS — C50119 Malignant neoplasm of central portion of unspecified female breast: Secondary | ICD-10-CM

## 2012-05-03 ENCOUNTER — Ambulatory Visit
Admission: RE | Admit: 2012-05-03 | Discharge: 2012-05-03 | Disposition: A | Discharge status: Home / Self Care | Payer: BC Managed Care – PPO | Source: Ambulatory Visit | Attending: Radiation Oncology | Admitting: Radiation Oncology

## 2012-05-03 DIAGNOSIS — Z510 Encounter for antineoplastic radiation therapy: Secondary | ICD-10-CM | POA: Insufficient documentation

## 2012-05-03 DIAGNOSIS — L589 Radiodermatitis, unspecified: Secondary | ICD-10-CM | POA: Insufficient documentation

## 2012-05-03 DIAGNOSIS — Y842 Radiological procedure and radiotherapy as the cause of abnormal reaction of the patient, or of later complication, without mention of misadventure at the time of the procedure: Secondary | ICD-10-CM | POA: Insufficient documentation

## 2012-05-03 DIAGNOSIS — Z79899 Other long term (current) drug therapy: Secondary | ICD-10-CM | POA: Insufficient documentation

## 2012-05-03 DIAGNOSIS — C50119 Malignant neoplasm of central portion of unspecified female breast: Secondary | ICD-10-CM

## 2012-05-03 DIAGNOSIS — L988 Other specified disorders of the skin and subcutaneous tissue: Secondary | ICD-10-CM | POA: Insufficient documentation

## 2012-05-03 NOTE — Progress Notes (Signed)
Simulation / Treatment Planning Note  The patient has a diagnosis of  left breast cancer; she is status post lumpectomy. She will receive whole breast radiotherapy. The patient was laid in the supine position on the treatment table with her arms over her head. Her head was in an Accuform device. I placed adhesive wiring over her lumpectomy scar and around the borders of her left breast tissue . High-resolution CT axial imaging was obtained of the patient's chest. An isocenter was placed in her anterior left lung. Skin markings were made and she tolerated the procedure well without any complications.  The patient was then scanned in the CT simulator while holding her breath. I verified with our physicist that this did not significantly increase the distance between her heart and chest wall. The patient did have trouble holding her breath steadily. We attempted scanning the patient once more with further coaching, but the images were similar. We will therefore not treat her with the breath-hold technique and base her planning on the free breathing images.   Treatment planning note: the patient will be treated with opposed tangential fields using MLCs for custom blocks. I plan to prescribe 50.4 Gray in 28 fractions to the whole breast. Three-dimensional conformal radiotherapy will be used in order to spare her heart and lungs. I have requested a DVH of her heart lungs and lumpectomy cavity. This will be followed by a boost of 10 Gray in 5 fractions the lumpectomy cavity. I anticipate her boost will be with 3 photon fields using MLCs for custom blocks.   We reviewed her imaging today as well:  Mm Digital Diagnostic Unilat L  04/30/2012  *RADIOLOGY REPORT*  Clinical Data:  The patient underwent lumpectomy for ductal carcinoma in situ and invasive ductal carcinoma in the medial portion of the left breast.  This study is requested to evaluate for suspicious residual calcifications prior to beginning radiation  therapy.  DIGITAL DIAGNOSTIC LEFT MAMMOGRAM WITH CAD  Comparison: 12/01/2011  Findings:  ACR Breast Density Category 1: The breast tissue is almost entirely fatty.  Left lumpectomy changes are present.  There is no suspicious dominant mass, nonsurgical architectural distortion or suspicious calcification to suggest residual malignancy.  Mammographic images were processed with CAD.  IMPRESSION: No mammographic evidence of malignancy.  RECOMMENDATION: Suggest bilateral diagnostic mammography in August 2014.  I have discussed the findings and recommendations with the patient. Results were also provided in writing at the conclusion of the visit.  BI-RADS CATEGORY 2:  Benign finding(s).   Original Report Authenticated By: Cain Saupe, M.D.    -----------------------------------  Lonie Peak, MD

## 2012-05-09 ENCOUNTER — Ambulatory Visit: Payer: BC Managed Care – PPO

## 2012-05-10 ENCOUNTER — Ambulatory Visit
Admission: RE | Admit: 2012-05-10 | Discharge: 2012-05-10 | Disposition: A | Discharge status: Home / Self Care | Payer: BC Managed Care – PPO | Source: Ambulatory Visit | Attending: Radiation Oncology | Admitting: Radiation Oncology

## 2012-05-10 ENCOUNTER — Ambulatory Visit: Payer: BC Managed Care – PPO

## 2012-05-10 DIAGNOSIS — C50119 Malignant neoplasm of central portion of unspecified female breast: Secondary | ICD-10-CM

## 2012-05-13 ENCOUNTER — Ambulatory Visit
Admission: RE | Admit: 2012-05-13 | Discharge: 2012-05-13 | Disposition: A | Discharge status: Home / Self Care | Payer: BC Managed Care – PPO | Source: Ambulatory Visit | Attending: Radiation Oncology | Admitting: Radiation Oncology

## 2012-05-13 ENCOUNTER — Encounter: Payer: Self-pay | Admitting: Radiation Oncology

## 2012-05-13 DIAGNOSIS — C50119 Malignant neoplasm of central portion of unspecified female breast: Secondary | ICD-10-CM

## 2012-05-13 MED ORDER — ALRA NON-METALLIC DEODORANT (RAD-ONC)
1.0000 "application " | Freq: Once | TOPICAL | Status: AC
  Administered 2012-05-13: 1 via TOPICAL

## 2012-05-13 MED ORDER — RADIAPLEXRX EX GEL
Freq: Once | CUTANEOUS | Status: AC
  Administered 2012-05-13: 19:00:00 via TOPICAL

## 2012-05-13 NOTE — Progress Notes (Signed)
Simulation Verification Note; DOS 05-10-12 Left Breast  The patient was brought to the treatment unit and placed in the planned treatment position. The clinical setup was verified. Then port films were obtained and uploaded to the radiation oncology medical record software.  The treatment beams were carefully compared against the planned radiation fields. The position location and shape of the radiation fields was reviewed. They targeted volume of tissue appears to be appropriately covered by the radiation beams. Organs at risk appear to be excluded as planned.  Based on my personal review, I approved the simulation verification. The patient's treatment will proceed as planned.  -----------------------------------  Lonie Peak, MD

## 2012-05-13 NOTE — Progress Notes (Signed)
   Weekly Management Note:  Outpatient - L breast Current Dose: 1.8 Gy  Projected Dose: 60.4 Gy   Narrative:  The patient presents for routine under treatment assessment.  CBCT/MVCT images/Port film x-rays were reviewed.  The chart was checked. Doing well.  Physical Findings: 274.2lb Skin exam deferred.   Impression:  The patient is tolerating radiotherapy.  Plan:  Continue radiotherapy as planned   ________________________________   Lonie Peak, M.D.

## 2012-05-13 NOTE — Progress Notes (Signed)
First fraction today.  No voiced concerns.  Seen by Dr. Basilio Cairo.  Post -simulation education today regarding management of pain, skin reactions, and fatigue.  Given Radiaplex Gel  With instructions on applying BID, after daily treatment and at bedtime, and given  Alra deodorant.  Given Radiation therapy and You booklet with appropriate pages marked.  Informed that Dr. will see her every Monday following her treatment, but if this changes she will be notified in advance.  Reminded to check in daily, obtain pager and wait for it to buzz before coming down, because the pages do not work on the ground level, and the therapist may think she has not arrived.  She stated understanding of all information.

## 2012-05-14 ENCOUNTER — Ambulatory Visit: Payer: BC Managed Care – PPO

## 2012-05-15 ENCOUNTER — Ambulatory Visit: Payer: BC Managed Care – PPO

## 2012-05-16 ENCOUNTER — Ambulatory Visit
Admission: RE | Admit: 2012-05-16 | Discharge: 2012-05-16 | Disposition: A | Discharge status: Home / Self Care | Payer: BC Managed Care – PPO | Source: Ambulatory Visit | Attending: Radiation Oncology | Admitting: Radiation Oncology

## 2012-05-17 ENCOUNTER — Ambulatory Visit
Admission: RE | Admit: 2012-05-17 | Discharge: 2012-05-17 | Disposition: A | Discharge status: Home / Self Care | Payer: BC Managed Care – PPO | Source: Ambulatory Visit | Attending: Radiation Oncology | Admitting: Radiation Oncology

## 2012-05-20 ENCOUNTER — Ambulatory Visit
Admission: RE | Admit: 2012-05-20 | Discharge: 2012-05-20 | Disposition: A | Discharge status: Home / Self Care | Payer: BC Managed Care – PPO | Source: Ambulatory Visit | Attending: Radiation Oncology | Admitting: Radiation Oncology

## 2012-05-20 VITALS — Temp 99.1°F | Resp 20 | Wt 274.0 lb

## 2012-05-20 DIAGNOSIS — C50119 Malignant neoplasm of central portion of unspecified female breast: Secondary | ICD-10-CM

## 2012-05-20 NOTE — Progress Notes (Signed)
   Weekly Management Note:  outpatient Current Dose:  7.2 Gy  Projected Dose: 60.4 Gy   Narrative:  The patient presents for routine under treatment assessment.  CBCT/MVCT images/Port film x-rays were reviewed.  The chart was checked. Doing well.  Has felt a little pain in breast and firmness at lumpectomy scar  Physical Findings:  weight is 274 lb (124.286 kg). Her oral temperature is 99.1 F (37.3 C). Her respiration is 20.  moderate seroma under lumpectomy scar with no signs of infection. Left breast is slightly erythematous.  Impression:  The patient is tolerating radiotherapy.  Plan:  Continue radiotherapy as planned.  ________________________________   Lonie Peak, M.D.

## 2012-05-20 NOTE — Progress Notes (Signed)
Patient here rad txs, 4/33 so far left breast , scant erythema on left breast,using radiaplex gel tid, no c/o nausea, , occasional stabbing pain in left breast resolves quickly 5:14 PM

## 2012-05-21 ENCOUNTER — Ambulatory Visit
Admission: RE | Admit: 2012-05-21 | Discharge: 2012-05-21 | Disposition: A | Discharge status: Home / Self Care | Payer: BC Managed Care – PPO | Source: Ambulatory Visit | Attending: Radiation Oncology | Admitting: Radiation Oncology

## 2012-05-22 ENCOUNTER — Ambulatory Visit
Admission: RE | Admit: 2012-05-22 | Discharge: 2012-05-22 | Disposition: A | Discharge status: Home / Self Care | Payer: BC Managed Care – PPO | Source: Ambulatory Visit | Attending: Radiation Oncology | Admitting: Radiation Oncology

## 2012-05-23 ENCOUNTER — Ambulatory Visit
Admission: RE | Admit: 2012-05-23 | Discharge: 2012-05-23 | Disposition: A | Discharge status: Home / Self Care | Payer: BC Managed Care – PPO | Source: Ambulatory Visit | Attending: Radiation Oncology | Admitting: Radiation Oncology

## 2012-05-24 ENCOUNTER — Ambulatory Visit
Admission: RE | Admit: 2012-05-24 | Discharge: 2012-05-24 | Disposition: A | Discharge status: Home / Self Care | Payer: BC Managed Care – PPO | Source: Ambulatory Visit | Attending: Radiation Oncology | Admitting: Radiation Oncology

## 2012-05-27 ENCOUNTER — Encounter: Payer: Self-pay | Admitting: Radiation Oncology

## 2012-05-27 ENCOUNTER — Ambulatory Visit
Admission: RE | Admit: 2012-05-27 | Discharge: 2012-05-27 | Disposition: A | Discharge status: Home / Self Care | Payer: BC Managed Care – PPO | Source: Ambulatory Visit | Attending: Radiation Oncology | Admitting: Radiation Oncology

## 2012-05-27 VITALS — BP 149/107 | HR 77 | Temp 98.1°F | Wt 277.6 lb

## 2012-05-27 DIAGNOSIS — C50119 Malignant neoplasm of central portion of unspecified female breast: Secondary | ICD-10-CM

## 2012-05-27 MED ORDER — RADIAPLEXRX EX GEL
Freq: Once | CUTANEOUS | Status: AC
  Administered 2012-05-27: 18:00:00 via TOPICAL

## 2012-05-27 NOTE — Progress Notes (Signed)
   Weekly Management Note:  outpatient Current Dose:  16.2 Gy  Projected Dose: 60.4 Gy   Narrative:  The patient presents for routine under treatment assessment.  CBCT/MVCT images/Port film x-rays were reviewed.  The chart was checked. No new complaints but a little tired  Physical Findings:  weight is 277 lb 9.6 oz (125.919 kg). Her temperature is 98.1 F (36.7 C). Her blood pressure is 149/107 and her pulse is 77.  slight erythema over left breast  Impression:  The patient is tolerating radiotherapy.  Plan:  Continue radiotherapy as planned.  ________________________________   Lonie Peak, M.D.

## 2012-05-27 NOTE — Progress Notes (Signed)
10 fractions to right breast.  Mild erythema noted and she feels that the area surrounding her incision "has more mass".  Denies any pain.  Given more radiaplex Gel.

## 2012-05-28 ENCOUNTER — Ambulatory Visit
Admission: RE | Admit: 2012-05-28 | Discharge: 2012-05-28 | Disposition: A | Discharge status: Home / Self Care | Payer: BC Managed Care – PPO | Source: Ambulatory Visit | Attending: Radiation Oncology | Admitting: Radiation Oncology

## 2012-05-29 ENCOUNTER — Ambulatory Visit
Admission: RE | Admit: 2012-05-29 | Discharge: 2012-05-29 | Disposition: A | Discharge status: Home / Self Care | Payer: BC Managed Care – PPO | Source: Ambulatory Visit | Attending: Radiation Oncology | Admitting: Radiation Oncology

## 2012-05-30 ENCOUNTER — Ambulatory Visit: Payer: BC Managed Care – PPO

## 2012-05-31 ENCOUNTER — Ambulatory Visit
Admission: RE | Admit: 2012-05-31 | Discharge: 2012-05-31 | Disposition: A | Discharge status: Home / Self Care | Payer: BC Managed Care – PPO | Source: Ambulatory Visit | Attending: Radiation Oncology | Admitting: Radiation Oncology

## 2012-05-31 ENCOUNTER — Ambulatory Visit: Payer: BC Managed Care – PPO

## 2012-06-03 ENCOUNTER — Ambulatory Visit
Admission: RE | Admit: 2012-06-03 | Discharge: 2012-06-03 | Disposition: A | Discharge status: Home / Self Care | Payer: BC Managed Care – PPO | Source: Ambulatory Visit | Attending: Radiation Oncology | Admitting: Radiation Oncology

## 2012-06-03 DIAGNOSIS — C50119 Malignant neoplasm of central portion of unspecified female breast: Secondary | ICD-10-CM

## 2012-06-03 NOTE — Progress Notes (Signed)
Received patient in the clinic today for BP check per Dr. Colletta Maryland order. Blood pressure elevated but, not as high in comparison to last assessment. Patient denies need to see Dr. Basilio Cairo again. Patient denies headache, dizziness, nausea, or vomiting. Patient reports fatigue associated with radiation therapy and inability to sleep. Informed Dr. Basilio Cairo of these findings.

## 2012-06-03 NOTE — Progress Notes (Signed)
   Weekly Management Note: outpatient Current Dose:  23.4Gy  Projected Dose: 60.4Gy   Narrative:  The patient presents for routine under treatment assessment.  CBCT/MVCT images/Port film x-rays were reviewed.  The chart was checked. She is doing well. Skin is somewhat more irritated. She is still wearing an underwire bra and is fairly adamant about this because she needs it for support while working  Physical Findings: Moderate erythema through the left breast. Skin still intact. Mild radiation dermatitis in the upper-inner quadrant  Impression:  The patient is tolerating radiotherapy.  Plan:  Continue radiotherapy as planned. Suggested applying a Maxipad to the inframammary fold while wearing a bra. Continue radiaplex,  Plus 1% hydrocortisone cream if itching develops, Neosporin if skin breakdown develops ________________________________   Lonie Peak, M.D.

## 2012-06-04 ENCOUNTER — Ambulatory Visit
Admission: RE | Admit: 2012-06-04 | Discharge: 2012-06-04 | Disposition: A | Discharge status: Home / Self Care | Payer: BC Managed Care – PPO | Source: Ambulatory Visit | Attending: Radiation Oncology | Admitting: Radiation Oncology

## 2012-06-05 ENCOUNTER — Ambulatory Visit
Admission: RE | Admit: 2012-06-05 | Discharge: 2012-06-05 | Disposition: A | Discharge status: Home / Self Care | Payer: BC Managed Care – PPO | Source: Ambulatory Visit | Attending: Radiation Oncology | Admitting: Radiation Oncology

## 2012-06-06 ENCOUNTER — Ambulatory Visit
Admission: RE | Admit: 2012-06-06 | Discharge: 2012-06-06 | Disposition: A | Discharge status: Home / Self Care | Payer: BC Managed Care – PPO | Source: Ambulatory Visit | Attending: Radiation Oncology | Admitting: Radiation Oncology

## 2012-06-07 ENCOUNTER — Other Ambulatory Visit: Payer: Self-pay | Admitting: Physician Assistant

## 2012-06-07 ENCOUNTER — Ambulatory Visit
Admission: RE | Admit: 2012-06-07 | Discharge: 2012-06-07 | Disposition: A | Discharge status: Home / Self Care | Payer: BC Managed Care – PPO | Source: Ambulatory Visit | Attending: Radiation Oncology | Admitting: Radiation Oncology

## 2012-06-10 ENCOUNTER — Ambulatory Visit
Admission: RE | Admit: 2012-06-10 | Discharge: 2012-06-10 | Disposition: A | Discharge status: Home / Self Care | Payer: BC Managed Care – PPO | Source: Ambulatory Visit | Attending: Radiation Oncology | Admitting: Radiation Oncology

## 2012-06-10 ENCOUNTER — Other Ambulatory Visit: Payer: Self-pay | Admitting: Physician Assistant

## 2012-06-10 ENCOUNTER — Encounter: Payer: Self-pay | Admitting: Radiation Oncology

## 2012-06-10 VITALS — BP 141/94 | HR 86 | Temp 99.0°F | Resp 20 | Wt 278.0 lb

## 2012-06-10 NOTE — Progress Notes (Signed)
Pt states she has moist desquamation in her left inframammary fold, is applying Radiaplex lotion. She also reports fatigue, going to bed earlier at night.

## 2012-06-10 NOTE — Progress Notes (Signed)
   Weekly Management Note:  outpatient Current Dose:  32.4 Gy  Projected Dose: 60.4 Gy   Narrative:  The patient presents for routine under treatment assessment.  CBCT/MVCT images/Port film x-rays were reviewed.  The chart was checked. Moist desquamation at inframammary fold - applying guaze and radiaplex  Physical Findings:  weight is 278 lb (126.1 kg). Her oral temperature is 99 F (37.2 C). Her blood pressure is 141/94 and her pulse is 86. Her respiration is 20.  Early Moist desquamation at left inframammary fold  Impression:  The patient is tolerating radiotherapy.  Plan:  Continue radiotherapy as planned. Will use hydrophyllic foam dressings and neosporin.  ________________________________   Lonie Peak, M.D.

## 2012-06-11 ENCOUNTER — Ambulatory Visit
Admission: RE | Admit: 2012-06-11 | Discharge: 2012-06-11 | Disposition: A | Discharge status: Home / Self Care | Payer: BC Managed Care – PPO | Source: Ambulatory Visit | Attending: Radiation Oncology | Admitting: Radiation Oncology

## 2012-06-12 ENCOUNTER — Ambulatory Visit
Admission: RE | Admit: 2012-06-12 | Discharge: 2012-06-12 | Disposition: A | Discharge status: Home / Self Care | Payer: BC Managed Care – PPO | Source: Ambulatory Visit | Attending: Radiation Oncology | Admitting: Radiation Oncology

## 2012-06-12 ENCOUNTER — Telehealth: Payer: Self-pay | Admitting: Oncology

## 2012-06-12 NOTE — Telephone Encounter (Signed)
spoke with pt..pt is aware of d/t of her appt....td

## 2012-06-13 ENCOUNTER — Ambulatory Visit
Admission: RE | Admit: 2012-06-13 | Discharge: 2012-06-13 | Disposition: A | Discharge status: Home / Self Care | Payer: BC Managed Care – PPO | Source: Ambulatory Visit | Attending: Radiation Oncology | Admitting: Radiation Oncology

## 2012-06-14 ENCOUNTER — Ambulatory Visit
Admission: RE | Admit: 2012-06-14 | Discharge: 2012-06-14 | Disposition: A | Discharge status: Home / Self Care | Payer: BC Managed Care – PPO | Source: Ambulatory Visit | Attending: Radiation Oncology | Admitting: Radiation Oncology

## 2012-06-17 ENCOUNTER — Encounter: Payer: Self-pay | Admitting: Radiation Oncology

## 2012-06-17 ENCOUNTER — Ambulatory Visit
Admission: RE | Admit: 2012-06-17 | Discharge: 2012-06-17 | Disposition: A | Discharge status: Home / Self Care | Payer: BC Managed Care – PPO | Source: Ambulatory Visit | Attending: Radiation Oncology | Admitting: Radiation Oncology

## 2012-06-17 VITALS — BP 136/91 | HR 79 | Temp 98.0°F | Resp 20 | Wt 274.0 lb

## 2012-06-17 DIAGNOSIS — C50112 Malignant neoplasm of central portion of left female breast: Secondary | ICD-10-CM

## 2012-06-17 NOTE — Progress Notes (Addendum)
   Weekly Management Note:  outpatient Current Dose:  41.4 Gy  Projected Dose: 60.4 Gy   Narrative:  The patient presents for routine under treatment assessment.  CBCT/MVCT images/Port film x-rays were reviewed.  The chart was checked. Left breast is more irritated. She stopped wearing an underwire bra. She is using the hydrocele is dressings. Apply Neosporin to the inframammary fold. Radiaplex elsewhere  Physical Findings:  weight is 274 lb (124.286 kg). Her oral temperature is 98 F (36.7 C). Her blood pressure is 136/91 and her pulse is 79. Her respiration is 20.  she has moist desquamation of the left inframammary fold. The left breast is hyperpigmented  Impression:  The patient is tolerating radiotherapy.  Plan:  Continue radiotherapy as planned. Patient provided with more hydrophilic dressings. Continue skin care with Neosporin and radiaplex  ________________________________   Lonie Peak, M.D.

## 2012-06-17 NOTE — Progress Notes (Signed)
Pt states she continues to apply Neosporin to "blistered skin" of left breast and Radiaplex. She reportss less fatigue this week.

## 2012-06-18 ENCOUNTER — Ambulatory Visit
Admission: RE | Admit: 2012-06-18 | Discharge: 2012-06-18 | Disposition: A | Discharge status: Home / Self Care | Payer: BC Managed Care – PPO | Source: Ambulatory Visit | Attending: Radiation Oncology | Admitting: Radiation Oncology

## 2012-06-18 ENCOUNTER — Encounter: Payer: Self-pay | Admitting: Radiation Oncology

## 2012-06-19 ENCOUNTER — Ambulatory Visit
Admission: RE | Admit: 2012-06-19 | Discharge: 2012-06-19 | Disposition: A | Discharge status: Home / Self Care | Payer: BC Managed Care – PPO | Source: Ambulatory Visit | Attending: Radiation Oncology | Admitting: Radiation Oncology

## 2012-06-20 ENCOUNTER — Ambulatory Visit: Payer: BC Managed Care – PPO

## 2012-06-20 ENCOUNTER — Ambulatory Visit
Admission: RE | Admit: 2012-06-20 | Discharge: 2012-06-20 | Disposition: A | Discharge status: Home / Self Care | Payer: BC Managed Care – PPO | Source: Ambulatory Visit | Attending: Radiation Oncology | Admitting: Radiation Oncology

## 2012-06-20 VITALS — BP 138/97 | HR 81 | Temp 98.2°F

## 2012-06-20 DIAGNOSIS — C50112 Malignant neoplasm of central portion of left female breast: Secondary | ICD-10-CM

## 2012-06-20 MED ORDER — SILVER SULFADIAZINE 1 % EX CREA
TOPICAL_CREAM | Freq: Two times a day (BID) | CUTANEOUS | Status: DC
  Administered 2012-06-20: 17:00:00 via TOPICAL

## 2012-06-20 NOTE — Addendum Note (Signed)
Encounter addended by: Delynn Flavin, RN on: 06/20/2012  7:23 PM<BR>     Documentation filed: Inpatient MAR

## 2012-06-20 NOTE — Progress Notes (Addendum)
Ms. Devlin examined by Dr. Roselind Messier.  He noted that she has some re-epithelization on  the underside of her left breast.  Note moist desquamation in the inframmary fold and an isolated most area with yellow exudate on the lateral side of her left breast.  Dr. Roselind Messier ordered Silvadene with instructions to apply BID to the inframmary fold, underside and lateral side of breast.  Forewarned that she may experience a burning sensation upon application which subsides quickly, but if she has persistent burning, for a prolonged period, then she needs to wash off the Silvadene and resume neosporin until she can be examined by a physician in our clinic.  She stated understanding.  Also instructed to cleanse old Silvadene completely off of the affected area between application and she stated understanding.  Given Foam pads to apply to the inframmary fold for comfort.  Instructed to not to wear her bra home and to not wear her bra tomorrow or over the weekend since she is not working for the next 3 days.

## 2012-06-20 NOTE — Progress Notes (Signed)
[facility]    Radiation Oncology [address] [place]     Maryln Gottron, M.D. [place], [place] [address]               Billie Lade, M.D., Ph.D. Phone: [phone]      Molli Hazard A. Kathrynn Running, M.D. Fax: [phone]      Radene Gunning, M.D., Ph.D.         Lurline Hare, M.D.         Grayland Jack, M.D Weekly Treatment Management Note  Name: Alicia Ferguson     MRN: [id number]        CSN: [id number] Date: 06/20/2012      DOB: 07-06-1960  CC: Daisy Floro, MD         Tenny Craw    Status: Outpatient  Diagnosis: The encounter diagnosis was Carcinoma of central portion of female breast, left.  Current Dose: 46.8 Gy  Current Fraction: 26  Planned Dose: 60.4 Gy  Narrative: [name] was seen today for weekly treatment management. The chart was checked and port films  were reviewed. The patient is seen today for increasing discomfort and some drainage in the left inframammary fold area. Patient is using Advil which seems to control her pain. She is also using Neosporin in the inframammary fold and radiaplex elsewhere. She is also using hydrogel dressings.  She denies any chills or fever.  Review of patient's allergies indicates no known allergies.  Current Outpatient Prescriptions  Medication Sig Dispense Refill  . calcium-vitamin D (OSCAL) 250-125 MG-UNIT per tablet Take 1 tablet by mouth every other day.      . hyaluronate sodium (RADIAPLEXRX) GEL Apply 1 application topically 3 (three) times daily.      Marland Kitchen ibuprofen (ADVIL,MOTRIN) 200 MG tablet Take 800 mg by mouth as needed. For pain      . non-metallic deodorant (ALRA) MISC Apply 1 application topically daily as needed.      . ramipril (ALTACE) 10 MG capsule Take 2 capsules by mouth daily.       Current Facility-Administered Medications  Medication Dose Route Frequency [place] Last Rate Last Dose  . silver sulfADIAZINE (SILVADENE) 1 % cream   Topical BID Billie Lade, MD          Physical Examination:   temperature is 98.2 F (36.8 C). Her blood pressure is 138/97 and her pulse is 81.    Wt Readings from Last 3 Encounters:  06/17/12 274 lb (124.286 kg)  06/10/12 278 lb (126.1 kg)  06/03/12 273 lb 11.2 oz (124.15 kg)    The left breast area shows diffuse erythema and hyperpigmentation changes throughout. Along the lower breast area near the inframammary fold the patient has areas of new islands of skin growth consistent with healing moist desquamation. This area is moist on exam today without signs of infection. Lungs - Normal respiratory effort, chest expands symmetrically. Lungs are clear to auscultation, no crackles or wheezes.  Heart has regular rhythm and rate  Abdomen is soft and non tender with normal bowel sounds  Assessment:  Patient tolerating treatments well except for issues as above.  Plan: Continue treatment per original radiation prescription. The patient was given Silvadene to place in the inframammary fold area. She will continue with radiaplex along other areas of the breast.

## 2012-06-21 ENCOUNTER — Ambulatory Visit: Payer: BC Managed Care – PPO

## 2012-06-24 ENCOUNTER — Ambulatory Visit
Admission: RE | Admit: 2012-06-24 | Discharge: 2012-06-24 | Disposition: A | Discharge status: Home / Self Care | Payer: BC Managed Care – PPO | Source: Ambulatory Visit | Attending: Radiation Oncology | Admitting: Radiation Oncology

## 2012-06-24 ENCOUNTER — Encounter: Payer: Self-pay | Admitting: Radiation Oncology

## 2012-06-24 VITALS — BP 128/90 | HR 79 | Resp 16 | Wt 270.1 lb

## 2012-06-24 DIAGNOSIS — C50112 Malignant neoplasm of central portion of left female breast: Secondary | ICD-10-CM

## 2012-06-24 NOTE — Progress Notes (Signed)
   Weekly Management Note:  outpatient Current Dose:  48.6 Gy  Projected Dose: 60.4 Gy   Narrative:  The patient presents for routine under treatment assessment.  CBCT/MVCT images/Port film x-rays were reviewed.  The chart was checked. Doing well. Silvadene improving desquamation.  Has some left forearm pain, worsening. No swelling in arm  Physical Findings:  weight is 270 lb 1.6 oz (122.517 kg). Her blood pressure is 128/90 and her pulse is 79. Her respiration is 16.  No LE in arms. Left breast diffusely pigmented, erythematous. Resolving moist desquamation at inframammary fold.    Impression:  The patient is tolerating radiotherapy.  Plan:  Continue radiotherapy as planned. Continue Silvadene and Radiaplex. Pt will contact PT re: forearm pain.  ________________________________   Lonie Peak, M.D.

## 2012-06-24 NOTE — Progress Notes (Signed)
Patient presents to the clinic today unaccompanied for PUT with Dr. Basilio Cairo. Patient alert and oriented to person, place, and time. No distress noted. Steady gait noted. Pleasant affect noted. Patient denies pain at this time. Hyperpigmentation with dry desquamation under left breast. Patient reports that the breakdown under her left breast is better since using SSD over the weekend. Patient reports using radiaplex on the remaining breast tissue. Patient reports that her energy level is gradually improving. Patient reports that is going to try and return to the gym tomorrow. Reported all findings to Dr. Basilio Cairo.

## 2012-06-25 ENCOUNTER — Ambulatory Visit
Admission: RE | Admit: 2012-06-25 | Discharge: 2012-06-25 | Disposition: A | Discharge status: Home / Self Care | Payer: BC Managed Care – PPO | Source: Ambulatory Visit | Attending: Radiation Oncology | Admitting: Radiation Oncology

## 2012-06-26 ENCOUNTER — Ambulatory Visit
Admission: RE | Admit: 2012-06-26 | Discharge: 2012-06-26 | Disposition: A | Discharge status: Home / Self Care | Payer: BC Managed Care – PPO | Source: Ambulatory Visit | Attending: Radiation Oncology | Admitting: Radiation Oncology

## 2012-06-26 ENCOUNTER — Ambulatory Visit: Payer: BC Managed Care – PPO

## 2012-06-27 ENCOUNTER — Ambulatory Visit: Payer: BC Managed Care – PPO

## 2012-06-27 ENCOUNTER — Ambulatory Visit
Admission: RE | Admit: 2012-06-27 | Discharge: 2012-06-27 | Disposition: A | Discharge status: Home / Self Care | Payer: BC Managed Care – PPO | Source: Ambulatory Visit | Attending: Radiation Oncology | Admitting: Radiation Oncology

## 2012-06-28 ENCOUNTER — Ambulatory Visit
Admission: RE | Admit: 2012-06-28 | Discharge: 2012-06-28 | Disposition: A | Discharge status: Home / Self Care | Payer: BC Managed Care – PPO | Source: Ambulatory Visit | Attending: Radiation Oncology | Admitting: Radiation Oncology

## 2012-06-28 ENCOUNTER — Ambulatory Visit: Payer: BC Managed Care – PPO

## 2012-07-01 ENCOUNTER — Ambulatory Visit: Payer: BC Managed Care – PPO

## 2012-07-01 ENCOUNTER — Ambulatory Visit
Admission: RE | Admit: 2012-07-01 | Discharge: 2012-07-01 | Disposition: A | Discharge status: Home / Self Care | Payer: BC Managed Care – PPO | Source: Ambulatory Visit | Attending: Radiation Oncology | Admitting: Radiation Oncology

## 2012-07-02 ENCOUNTER — Ambulatory Visit
Admission: RE | Admit: 2012-07-02 | Discharge: 2012-07-02 | Disposition: A | Discharge status: Home / Self Care | Payer: BC Managed Care – PPO | Source: Ambulatory Visit | Attending: Radiation Oncology | Admitting: Radiation Oncology

## 2012-07-02 ENCOUNTER — Encounter: Payer: Self-pay | Admitting: Radiation Oncology

## 2012-07-02 VITALS — BP 139/91 | HR 87 | Temp 97.5°F | Wt 273.2 lb

## 2012-07-02 DIAGNOSIS — C50112 Malignant neoplasm of central portion of left female breast: Secondary | ICD-10-CM

## 2012-07-02 NOTE — Progress Notes (Signed)
   Weekly Management Note Completed Radiotherapy. Total Dose:  60.4Gy   Narrative:  The patient presents for routine under treatment assessment on last day of radiotherapy.  CBCT/MVCT images/Port film x-rays were reviewed.  The chart was checked. Skin improved with Silvadene at IM fold - moist desquamation gone.  Physical Findings:  weight is 273 lb 3.2 oz (123.923 kg). Her temperature is 97.5 F (36.4 C). Her blood pressure is 139/91 and her pulse is 87.  Skin improved with Silvadene at IM fold - moist desquamation gone. Rest of left breast is erythematous  Impression:  The patient has tolerated radiotherapy.  Plan:  Routine follow-up in one month. Continue silvadene x 2-3 days, radiaplex over rest of breast and then Vitamin E cream when skin no longer irritated ________________________________   Lonie Peak, M.D.

## 2012-07-02 NOTE — Progress Notes (Signed)
Alicia Ferguson completes radiation today to her Left Breast.  Desquamation in the inframmary fold has resolved but not rash like area in the inner portion of her left breast near her mediastinal region.  Using Silvadene  Since 06/20/12. .  She denies any pain and states her energy level is better. She has an appt. For fu in April.

## 2012-07-04 NOTE — Progress Notes (Signed)
Name: Alicia Ferguson   MRN: [id number]  Date:  06/18/2012   DOB: [birth year]  Status:outpatient    DIAGNOSIS: Breast cancer.  CONSENT VERIFIED: yes   SET UP: Patient is setup supine   IMMOBILIZATION:  The following immobilization was used:Custom Moldable Pillow, breast board.   NARRATIVE: Alicia Ferguson underwent complex simulation and treatment planning for her boost treatment today.  Her tumor volume was outlined on the planning CT scan. The depth of her cavity was measured  15  MeV electrons will be prescribed to the 91%  Isodose line.  10 Gy in 2 fractions prescribed to cover the left lumpectomy cavity.  A block will be used for beam modification purposes.  A special port plan is requested.   ________________________________  Lonie Peak, MD

## 2012-07-04 NOTE — Progress Notes (Signed)
  Radiation Oncology         [phone] ________________________________  Name: Alicia Ferguson MRN: [id number]  Date: 07/02/2012  DOB: [birth year]  End of Treatment Note  Diagnosis:  T1aN0M0 ER/PR- Her2 + breast cancer, left central breast  Indication for treatment: curative    Radiation treatment dates:   05/13/2012-07/02/2012  Site/dose:   1) Left Breast / 50.4Gy in 28 fractions 2) Left Breast Boost / 10 Gy in 5 fractions  Beams/energy:   1) Opposed Tangents / 10 and 18 MV photons 2) En Face electrons / 15 MeV electrons  Narrative: The patient tolerated radiation treatment with development of moist desquamation at her inframammary fold. This resolved with Silvadene.   Plan: The patient has completed radiation treatment. The patient will return to radiation oncology clinic for routine followup in one month. I advised them to call or return sooner if they have any questions or concerns related to their recovery or treatment.  -----------------------------------  Lonie Peak, MD

## 2012-07-31 ENCOUNTER — Telehealth: Payer: Self-pay | Admitting: *Deleted

## 2012-07-31 NOTE — Telephone Encounter (Signed)
Lm informing the pt that AGB will be on pal 4/22 and gv appt d/t for 08/16/12. i also made pt aware that i will mail a letter/cal...td

## 2012-08-02 ENCOUNTER — Ambulatory Visit: Payer: BC Managed Care – PPO | Admitting: Radiation Oncology

## 2012-08-05 ENCOUNTER — Ambulatory Visit: Payer: BC Managed Care – PPO | Admitting: Physician Assistant

## 2012-08-05 ENCOUNTER — Other Ambulatory Visit: Payer: BC Managed Care – PPO | Admitting: Lab

## 2012-08-06 ENCOUNTER — Other Ambulatory Visit: Payer: BC Managed Care – PPO | Admitting: Lab

## 2012-08-06 ENCOUNTER — Ambulatory Visit: Payer: BC Managed Care – PPO | Admitting: Physician Assistant

## 2012-08-08 ENCOUNTER — Encounter: Payer: Self-pay | Admitting: Radiation Oncology

## 2012-08-09 ENCOUNTER — Ambulatory Visit
Admission: RE | Admit: 2012-08-09 | Discharge: 2012-08-09 | Disposition: A | Discharge status: Home / Self Care | Payer: BC Managed Care – PPO | Source: Ambulatory Visit | Attending: Radiation Oncology | Admitting: Radiation Oncology

## 2012-08-09 ENCOUNTER — Encounter: Payer: Self-pay | Admitting: Radiation Oncology

## 2012-08-09 VITALS — BP 142/95 | HR 82 | Temp 98.2°F | Wt 280.0 lb

## 2012-08-09 DIAGNOSIS — C50112 Malignant neoplasm of central portion of left female breast: Secondary | ICD-10-CM

## 2012-08-09 HISTORY — DX: Personal history of irradiation: Z92.3

## 2012-08-09 NOTE — Progress Notes (Signed)
Alicia Ferguson here today for assessment following radiation to left breast.  Denies any pain and states that she does not have any lingering hyperpigmentation in the treatment field.

## 2012-08-11 ENCOUNTER — Encounter: Payer: Self-pay | Admitting: Radiation Oncology

## 2012-08-11 NOTE — Progress Notes (Signed)
  Radiation Oncology         [phone] ________________________________  Name: Alicia Ferguson MRN: [id number]  Date: 08/09/2012  DOB: February 18, 1961  Follow-Up Visit Note  outpatient  CC: Daisy Floro, MD  Currie Paris, MD  Diagnosis:   T1aN0M0 E/P (-) Her2+ left breast cancer  Interval Since Last Radiation: on 07/02/12 completed total dose 60.4Gy to left breast  Narrative:  The patient returns today for routine follow-up.  Skin has healed well. She  Feels good no complaints.                          ALLERGIES:  has No Known Allergies.  Meds: Current Outpatient Prescriptions  Medication Sig Dispense Refill  . calcium-vitamin D (OSCAL) 250-125 MG-UNIT per tablet Take 1 tablet by mouth every other day.      . ibuprofen (ADVIL,MOTRIN) 200 MG tablet Take 800 mg by mouth as needed. For pain      . ramipril (ALTACE) 10 MG capsule Take 2 capsules by mouth daily.       No current facility-administered medications for this encounter.    Physical Findings: The patient is in no acute distress. Patient is alert and oriented.  weight is 280 lb (127.007 kg). Her temperature is 98.2 F (36.8 C). Her blood pressure is 142/95 and her pulse is 82. .  No significant changes. Mild residual skin hyperpigmentation over left breast. Skin looks excellent.  Lab Findings: Lab Results  Component Value Date   WBC 8.7 04/04/2012   HGB 12.3 04/04/2012   HCT 36.4 04/04/2012   MCV 83.2 04/04/2012   PLT 345 04/04/2012       Radiographic Findings: No results found.  Impression/Plan:  Doing well.   Will order mammography for mid August, and f/u with me soon after that.  I spent 20 minutes minutes face to face with the patient and more than 50% of that time was spent in counseling and/or coordination of care. _____________________________________   Lonie Peak, MD

## 2012-08-16 ENCOUNTER — Other Ambulatory Visit (HOSPITAL_BASED_OUTPATIENT_CLINIC_OR_DEPARTMENT_OTHER): Payer: BC Managed Care – PPO | Admitting: Lab

## 2012-08-16 ENCOUNTER — Telehealth: Payer: Self-pay | Admitting: Oncology

## 2012-08-16 ENCOUNTER — Ambulatory Visit (HOSPITAL_BASED_OUTPATIENT_CLINIC_OR_DEPARTMENT_OTHER): Payer: BC Managed Care – PPO | Admitting: Physician Assistant

## 2012-08-16 ENCOUNTER — Encounter: Payer: Self-pay | Admitting: Physician Assistant

## 2012-08-16 VITALS — BP 131/85 | HR 74 | Temp 98.2°F | Resp 20 | Ht 66.5 in | Wt 280.3 lb

## 2012-08-16 DIAGNOSIS — C50519 Malignant neoplasm of lower-outer quadrant of unspecified female breast: Secondary | ICD-10-CM

## 2012-08-16 DIAGNOSIS — C50112 Malignant neoplasm of central portion of left female breast: Secondary | ICD-10-CM

## 2012-08-16 DIAGNOSIS — Z853 Personal history of malignant neoplasm of breast: Secondary | ICD-10-CM

## 2012-08-16 DIAGNOSIS — Z170 Estrogen receptor positive status [ER+]: Secondary | ICD-10-CM

## 2012-08-16 DIAGNOSIS — C50119 Malignant neoplasm of central portion of unspecified female breast: Secondary | ICD-10-CM

## 2012-08-16 LAB — CBC WITH DIFFERENTIAL/PLATELET
Basophils Absolute: 0.1 10*3/uL (ref 0.0–0.1)
Eosinophils Absolute: 0.1 10*3/uL (ref 0.0–0.5)
HCT: 38 % (ref 34.8–46.6)
HGB: 12.6 g/dL (ref 11.6–15.9)
LYMPH%: 20.4 % (ref 14.0–49.7)
MONO#: 0.6 10*3/uL (ref 0.1–0.9)
NEUT#: 4.3 10*3/uL (ref 1.5–6.5)
Platelets: 296 10*3/uL (ref 145–400)
RBC: 4.59 10*6/uL (ref 3.70–5.45)
WBC: 6.3 10*3/uL (ref 3.9–10.3)

## 2012-08-16 LAB — COMPREHENSIVE METABOLIC PANEL (CC13)
Albumin: 3.3 g/dL — ABNORMAL LOW (ref 3.5–5.0)
CO2: 23 mEq/L (ref 22–29)
Glucose: 112 mg/dl — ABNORMAL HIGH (ref 70–99)
Potassium: 4.2 mEq/L (ref 3.5–5.1)
Sodium: 140 mEq/L (ref 136–145)
Total Bilirubin: 0.89 mg/dL (ref 0.20–1.20)
Total Protein: 6.6 g/dL (ref 6.4–8.3)

## 2012-08-16 NOTE — Progress Notes (Signed)
ID: Alicia Ferguson   DOB: 11-02-1960  MR#: [id number]  CSN#:[id number]  PCP: Alicia Floro, MD GYN:  Alicia Duck MD OTHER MD: Alicia Ferguson, Alicia Ferguson   HISTORY OF PRESENT ILLNESS: [name] had bilateral screening mammography at the [facility] 12/01/2011. There were some calcifications in the left breast and a possible mass in the right breast, leading to diagnostic mammography 01/29/2012. There were 2 areas of calcification in the left breast, without associated mass or distortion. In the right breast there was a 3 cm oval area of increased density. Neither of these abnormalities were palpable. Ultrasound was noninformative. On the same day, biopsy of one of the left breast lesions was obtained, and showed ([id number]) invasive ductal carcinoma, grade 1, estrogen and progesterone receptor negative, with an MIB-1 of 51%, and an equivocal HER-2 at 2.02  On 02/03/2012 the patient underwent bilateral breast MRI. In the left breast there was an irregular enhancing mass measuring 2.5 cm, which is the one that had been biopsied. In additional, there was a 7 mm mass anterior to the previously biopsied mass and a third mass in the lower outer quadrant of the left breast. The right breast was unremarkable, and there was no axillary or internal mammary adenopathy noted.  Biopsy of the lateral breast mass on 02/12/2012 showed only fibrocystic changes. ([id number]). The patient's subsequent history is as detailed below.  INTERVAL HISTORY:  Alicia Ferguson returns today for followup of her right breast cancer.  Since her last appointment here, she has completed radiation which she tolerated well.  She has recovered well with no complications or problems.  In fact, with the exception of some weight gain, she has no new complaints today.   REVIEW OF SYSTEMS: Alicia Ferguson has had no fever, chills, or night sweats.  She denies abnormal bleeding.  She has had no change in bowel or bladder habits.  She denies a cough, shortness  of breath, cheat pain or palpitations.  No abnormal headaches or dizziness, and no new or unusual myalgias, arthralgias, or bony pain.  She has chronic swelling in the feet and ankles, but this has not changed.  A detailed review of systems was otherwise noncontributory.  PAST MEDICAL HISTORY: Past Medical History  Diagnosis Date  . Breast cancer   . PONV (postoperative nausea and vomiting)   . Hypertension     takes Ramipril daily  . Neck pain     bulging disc  . History of colon polyps   . Urinary frequency   . History of kidney stones   . Vitamin D deficiency     takes Oscal every other day  . S/P radiation therapy 05/13/12 - 07/02/12    Left Breast / 50.4 Gy / 28 Fractions: Left Breast Boost/ 10 Gy / 5 Fractions    PAST SURGICAL HISTORY: Past Surgical History  Procedure Laterality Date  . Cholecystectomy  2000  . Neck surgery  2001  . Tubal ligation  1998  . Back surgery  1984  . Ovarian cysts removed    . Blephoplasty      blephoplasty  . Colonoscopy    . Breast lumpectomy with needle localization and axillary sentinel lymph node bx  03/20/2012    Procedure: BREAST LUMPECTOMY WITH NEEDLE LOCALIZATION AND AXILLARY SENTINEL LYMPH NODE BX;  Surgeon: Currie Paris, MD;  Location: [facility] OR;  Service: General;  Laterality: Left;  with removal of skin tags from both breasts.  . Portacath placement  03/20/2012  Procedure: INSERTION PORT-A-CATH;  Surgeon: Currie Paris, MD;  Location: [facility] OR;  Service: General;  Laterality: Right;  Right internal jugular  . Port-a-cath removal  04/23/2012    Procedure: MINOR REMOVAL PORT-A-CATH;  Surgeon: Currie Paris, MD;  Location: [facility];  Service: General;  Laterality: N/A;    FAMILY HISTORY Family History  Problem Relation Age of Onset  . Allergies Mother   . Asthma Mother   . Colon cancer Maternal Grandmother   . Prostate cancer Brother 10  the patient's father died at the age of 45 from a ruptured brain  aneurysm. The patient's mother died of "old age" at 50. The patient has one brother and one sister. The patient's maternal grandmother was diagnosed with colon cancer in her 13s. There is no history of breast or ovarian cancer in the family.  GYNECOLOGIC HISTORY: Menarche age 51, first live birth age 15. Menopause 2006. She is GX P2. 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 She is divorced, and lives alone, with no pets. Her son Alicia Ferguson 67 years old is studying Tourist information centre manager in [place]. Her daughter Alicia Ferguson 78 is studying Special educational needs teacher in [place]   ADVANCED DIRECTIVES: in place; the patient has named her sister, Alicia Ferguson, as her healthcare power of attorney. She can be reached at [phone]  HEALTH MAINTENANCE: History  Substance Use Topics  . Smoking status: Never Smoker   . Smokeless tobacco: Never Used  . Alcohol Use: Yes     Comment: rarely     Colonoscopy:  PAP:  Bone density:  Lipid panel:  No Known Allergies  Current Outpatient Prescriptions  Medication Sig Dispense Refill  . calcium-vitamin D (OSCAL) 250-125 MG-UNIT per tablet Take 1 tablet by mouth every other day.      . ibuprofen (ADVIL,MOTRIN) 200 MG tablet Take 800 mg by mouth as needed. For pain      . ramipril (ALTACE) 10 MG capsule Take 2 capsules by mouth daily.       No current facility-administered medications for this visit.    OBJECTIVE: Middle-aged white woman in no acute distress Filed Vitals:   08/16/12 0834  BP: 131/85  Pulse: 74  Temp: 98.2 F (36.8 C)  Resp: 20     Body mass index is 44.57 kg/(m^2).    ECOG FS: 0 Filed Weights   08/16/12 0834  Weight: 280 lb 4.8 oz (127.143 kg)   Sclerae unicteric Oropharynx clear No cervical or supraclavicular adenopathy Lungs clear to auscultation,no wheezes, no rales or rhonchi Heart regular rate and rhythm Abdomen soft, obese, nontender to palpation, positive  bowel sounds MSK no focal spinal tenderness, no peripheral edema Neuro: nonfocal, well oriented with positive affect Breasts: The right breast is unremarkable. The left breast is status post lumpectomy. No nodularity or evidence of local recurrence.  Only mild hyperpigmentation noted status post radiation therapy. Axillae benign bilaterally with no palpable adenopathy.   LAB RESULTS: Lab Results  Component Value Date   WBC 6.3 08/16/2012   NEUTROABS 4.3 08/16/2012   HGB 12.6 08/16/2012   HCT 38.0 08/16/2012   MCV 82.7 08/16/2012   PLT 296 08/16/2012      Chemistry      Component Value Date/Time   NA 140 08/16/2012 0818   NA 139 03/12/2012 1431   K 4.2 08/16/2012 0818   K 3.8 03/12/2012 1431   CL 108* 08/16/2012 0818   CL 101  03/12/2012 1431   CO2 23 08/16/2012 0818   CO2 30 03/12/2012 1431   BUN 14.7 08/16/2012 0818   BUN 14 03/12/2012 1431   CREATININE 0.8 08/16/2012 0818   CREATININE 0.80 03/12/2012 1431      Component Value Date/Time   CALCIUM 8.4 08/16/2012 0818   CALCIUM 9.2 03/12/2012 1431   ALKPHOS 60 08/16/2012 0818   AST 23 08/16/2012 0818   ALT 38 08/16/2012 0818   BILITOT 0.89 08/16/2012 0818        STUDIES:  No results found.    ASSESSMENT: 52 y.o. BRCA negative Alicia Ferguson woman   (1)  status post left lumpectomy 03/20/2012 for a pT1a pN0 invasive ductal carcinoma, grade 2, estrogen and progesterone receptor negative, HER-2 positive with a ratio of 2.81 by CISH, with an MIB-1 of 69%.  (2) biopsy 02/19/2012 of two additional lesions in the Left breast,  Benign  (3)  Status post radiation therapy, followed by observation alone.  PLAN:  Alicia Ferguson doing well regards to her breast cancer and we will continue to follow with observation a previously discussed.  She is due for her next mammogram in August, after which she will see Dr. Basilio Cairo.  She will then return in November to see [place] for a 6 month follow up.  Alicia Ferguson voices understanding and agreement with this plan and will contact us with  any questions prior to her next appointment.  Alicia Ferguson    08/16/2012

## 2012-11-20 ENCOUNTER — Other Ambulatory Visit: Payer: Self-pay | Admitting: Radiation Oncology

## 2012-11-20 DIAGNOSIS — C50112 Malignant neoplasm of central portion of left female breast: Secondary | ICD-10-CM

## 2012-12-02 ENCOUNTER — Ambulatory Visit
Admission: RE | Admit: 2012-12-02 | Discharge: 2012-12-02 | Disposition: A | Discharge status: Home / Self Care | Payer: BC Managed Care – PPO | Source: Ambulatory Visit | Attending: Physician Assistant | Admitting: Physician Assistant

## 2012-12-02 DIAGNOSIS — Z853 Personal history of malignant neoplasm of breast: Secondary | ICD-10-CM

## 2012-12-06 ENCOUNTER — Ambulatory Visit
Admission: RE | Admit: 2012-12-06 | Discharge: 2012-12-06 | Disposition: A | Discharge status: Home / Self Care | Payer: BC Managed Care – PPO | Source: Ambulatory Visit | Attending: Radiation Oncology | Admitting: Radiation Oncology

## 2012-12-06 ENCOUNTER — Encounter: Payer: Self-pay | Admitting: Radiation Oncology

## 2012-12-06 VITALS — BP 140/104 | HR 81 | Temp 97.9°F | Ht 66.5 in | Wt 279.9 lb

## 2012-12-06 DIAGNOSIS — C50112 Malignant neoplasm of central portion of left female breast: Secondary | ICD-10-CM

## 2012-12-06 NOTE — Progress Notes (Signed)
Alicia Ferguson here for assessment following radiation therapy to her left breast which completed on 07/02/12. She denies any pain in her left breast and c/o fatigue today.   Her 12/02/12 mammogram is attached to her records.

## 2012-12-06 NOTE — Progress Notes (Signed)
Radiation Oncology         [phone] ________________________________  Name: Alicia Ferguson MRN: [id number]  Date: 12/06/2012  DOB: [birth year]  Follow-Up Visit Note  Outpatient  CC: Alicia Floro, MD  Alicia Paris, MD  Diagnosis and Prior Radiotherapy:   T1aN0M0 E/P (-) Her2+ left breast cancer   Interval Since Last Radiation: on 07/02/12 completed total dose 60.4 Gy to left breast   Narrative: The patient returns today for routine follow-up.  She feels good, no complaints other than fatigue.  Not on anti-estrogen treatment.                                ALLERGIES:  has No Known Allergies.  Meds: Current Outpatient Prescriptions  Medication Sig Dispense Refill  . calcium-vitamin D (OSCAL) 250-125 MG-UNIT per tablet Take 1 tablet by mouth every other day.      . ibuprofen (ADVIL,MOTRIN) 200 MG tablet Take 800 mg by mouth as needed. For pain      . ramipril (ALTACE) 10 MG capsule Take 2 capsules by mouth daily.       No current facility-administered medications for this encounter.    Physical Findings: The patient is in no acute distress. Patient is alert and oriented.  height is 5' 6.5" (1.689 m) and weight is 279 lb 14.4 oz (126.962 kg). Her temperature is 97.9 F (36.6 C). Her blood pressure is 140/104 and her pulse is 81. . Left breast is a little hyperpigmented. Skin intact. No palpable lesions in either breast or axillary adenopathy appreciated.  Lab Findings: Lab Results  Component Value Date   WBC 6.3 08/16/2012   HGB 12.6 08/16/2012   HCT 38.0 08/16/2012   MCV 82.7 08/16/2012   PLT 296 08/16/2012    Radiographic Findings: Mm Digital Diagnostic Bilat  12/02/2012   *RADIOLOGY REPORT*  Clinical Data:  Patient presents for bilateral diagnostic mammogram due to a history of a prior left malignant lumpectomy December 2013.  This is also a follow-up of a probable benign right breast abnormality.  DIGITAL DIAGNOSTIC THE BILATERAL MAMMOGRAM WITH CAD  Comparison:  04/30/2012, 02/19/2012, 01/29/2012 and 12/01/2011  Findings:  ACR Breast Density Category b:  There are scattered areas of fibroglandular density.  Exam demonstrates post lumpectomy changes of the inner midportion of the left breast.  There are mild postradiation changes of the left breast.  The focal asymmetry over the upper outer right breast is stable.  Remainder of the exam is unremarkable.  Mammographic images were processed with CAD.  IMPRESSION: Post lumpectomy and radiation changes of the left breast.  Stable focal asymmetry over the upper outer right breast.  RECOMMENDATION: Recommend a follow-up bilateral diagnostic mammogram in 1 year to evaluate continue stability of the probable benign right breast asymmetry as well as left lumpectomy site.  I have discussed the findings and recommendations with the patient. Results were also provided in writing at the conclusion of the visit.  If applicable, a reminder letter will be sent to the patient regarding her next appointment.  BI-RADS CATEGORY 2:  Benign finding(s).   Original Report Authenticated By: Elberta Fortis, M.D.    Impression/Plan: Doing well with no evidence of disease. I will see her back in 1 year after her 2015 mammography. She knows to call if she needs anything before then.  I spent 10 minutes face to face with the patient and more than 50% of that time  was spent in counseling and/or coordination of care. _____________________________________   Eppie Gibson, MD

## 2013-03-04 ENCOUNTER — Encounter: Payer: Self-pay | Admitting: Physician Assistant

## 2013-03-04 ENCOUNTER — Encounter (INDEPENDENT_AMBULATORY_CARE_PROVIDER_SITE_OTHER): Payer: Self-pay

## 2013-03-04 ENCOUNTER — Telehealth: Payer: Self-pay | Admitting: *Deleted

## 2013-03-04 ENCOUNTER — Ambulatory Visit (HOSPITAL_BASED_OUTPATIENT_CLINIC_OR_DEPARTMENT_OTHER): Payer: BC Managed Care – PPO | Admitting: Physician Assistant

## 2013-03-04 ENCOUNTER — Other Ambulatory Visit (HOSPITAL_BASED_OUTPATIENT_CLINIC_OR_DEPARTMENT_OTHER): Payer: BC Managed Care – PPO | Admitting: Lab

## 2013-03-04 VITALS — BP 139/87 | HR 82 | Temp 97.7°F | Resp 18 | Ht 66.5 in | Wt 278.1 lb

## 2013-03-04 DIAGNOSIS — C50112 Malignant neoplasm of central portion of left female breast: Secondary | ICD-10-CM

## 2013-03-04 DIAGNOSIS — Z171 Estrogen receptor negative status [ER-]: Secondary | ICD-10-CM

## 2013-03-04 DIAGNOSIS — C50119 Malignant neoplasm of central portion of unspecified female breast: Secondary | ICD-10-CM

## 2013-03-04 DIAGNOSIS — Z853 Personal history of malignant neoplasm of breast: Secondary | ICD-10-CM

## 2013-03-04 LAB — COMPREHENSIVE METABOLIC PANEL (CC13)
Albumin: 3.5 g/dL (ref 3.5–5.0)
Anion Gap: 9 mEq/L (ref 3–11)
CO2: 23 mEq/L (ref 22–29)
Chloride: 109 mEq/L (ref 98–109)
Glucose: 129 mg/dl (ref 70–140)
Potassium: 3.9 mEq/L (ref 3.5–5.1)
Sodium: 141 mEq/L (ref 136–145)
Total Protein: 6.7 g/dL (ref 6.4–8.3)

## 2013-03-04 LAB — CBC WITH DIFFERENTIAL/PLATELET
Eosinophils Absolute: 0.1 10*3/uL (ref 0.0–0.5)
MONO#: 0.4 10*3/uL (ref 0.1–0.9)
NEUT#: 4.1 10*3/uL (ref 1.5–6.5)
RBC: 4.73 10*6/uL (ref 3.70–5.45)
RDW: 14.7 % — ABNORMAL HIGH (ref 11.2–14.5)
WBC: 6.1 10*3/uL (ref 3.9–10.3)

## 2013-03-04 NOTE — Progress Notes (Signed)
ID: [name]   DOB: 09/26/1960  MR#: [id number]  CSN#:[id number]  PCP:  GYN:  SUCicero Duck MD OTHER MD: Etter Sjogren, MD; Marcelyn Bruins, MD  CHIEF COMPLAINT:  Left Breast Cancer   HISTORY OF PRESENT ILLNESS: Lien had bilateral screening mammography at the [facility] 12/01/2011. There were some calcifications in the left breast and a possible mass in the right breast, leading to diagnostic mammography 01/29/2012. There were 2 areas of calcification in the left breast, without associated mass or distortion. In the right breast there was a 3 cm oval area of increased density. Neither of these abnormalities were palpable. Ultrasound was noninformative. On the same day, biopsy of one of the left breast lesions was obtained, and showed ([id number]) invasive ductal carcinoma, grade 1, estrogen and progesterone receptor negative, with an MIB-1 of 51%, and an equivocal HER-2 at 2.02  On 02/03/2012 the patient underwent bilateral breast MRI. In the left breast there was an irregular enhancing mass measuring 2.5 cm, which is the one that had been biopsied. In additional, there was a 7 mm mass anterior to the previously biopsied mass and a third mass in the lower outer quadrant of the left breast. The right breast was unremarkable, and there was no axillary or internal mammary adenopathy noted.  Biopsy of the lateral breast mass on 02/12/2012 showed only fibrocystic changes. ([id number]). The patient's subsequent history is as detailed below.  INTERVAL HISTORY:  Bethsaida returns today for followup of her left breast cancer.  She continues to be followed with observation alone, and is doing very well. In fact, she tells me that since the summer, her energy has finally begun to improve. She plays tennis on a regular basis, and is feeling more like herself.  Dessirae took a family vacation to [place] in August. There were 11 family members, and a trip was "great". Her son is getting ready to graduate from [employer] in a couple of weeks, and her daughter is in her second year at  [employer] in [place], [place].   REVIEW OF SYSTEMS: Physically, Lamyra has no new complaints. She's had no recent illnesses and denies any fevers, chills, or night sweats. She's had no skin changes and denies any abnormal bleeding. Her appetite is good, and she denies any nausea or emesis. She is trying to eat at least one piece of fruit daily, and this has improved her bowel habits. She's had no change in urinary habits. She denies any cough, increased shortness of breath, chest pain, or palpitations. She's had no abnormal headaches or dizziness. She does have some chronic joint pain in her right knee which she attributes to arthritis. She has some occasional swelling in her feet and ankles, but this improves with elevation.  Otherwise a detailed review of systems is noncontributory.   PAST MEDICAL HISTORY: Past Medical History  Diagnosis Date  . Breast cancer   . PONV (postoperative nausea and vomiting)   . Hypertension     takes Ramipril daily  . Neck pain     bulging disc  . History of colon polyps   . Urinary frequency   . History of kidney stones   . Vitamin D deficiency     takes Oscal every other day  . S/P radiation therapy 05/13/12 - 07/02/12    Left Breast / 50.4 Gy / 28 Fractions: Left Breast Boost/ 10 Gy / 5 Fractions  . Cancer     PAST SURGICAL HISTORY: Past Surgical History  Procedure Laterality Date  . Cholecystectomy  2000  . Neck surgery  2001  . Tubal ligation  1998  . Back surgery  1984  . Ovarian cysts removed    . Blephoplasty      blephoplasty  . Colonoscopy    . Breast lumpectomy with needle localization and axillary sentinel lymph node bx  03/20/2012    Procedure: BREAST LUMPECTOMY WITH NEEDLE LOCALIZATION AND AXILLARY SENTINEL LYMPH NODE BX;  Surgeon: Currie Paris, MD;  Location: [facility] OR;  Service: General;  Laterality: Left;  with removal of skin tags from both breasts.  . Portacath  placement  03/20/2012    Procedure: INSERTION PORT-A-CATH;  Surgeon: Currie Paris, MD;  Location: [facility] OR;  Service: General;  Laterality: Right;  Right internal jugular  . Port-a-cath removal  04/23/2012    Procedure: MINOR REMOVAL PORT-A-CATH;  Surgeon: Currie Paris, MD;  Location: [facility];  Service: General;  Laterality: N/A;    FAMILY HISTORY Family History  Problem Relation Age of Onset  . Allergies Mother   . Asthma Mother   . Colon cancer Maternal Grandmother   . Prostate cancer Brother 41  the patient's father died at the age of 57 from a ruptured brain aneurysm. The patient's mother died of "old age" at 22. The patient has one brother and one sister. The patient's maternal grandmother was diagnosed with colon cancer in her 96s. There is no history of breast or ovarian cancer in the family.  GYNECOLOGIC HISTORY: Menarche age 51, first live birth age 71. Menopause 2006. She is GX P2. 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 She is divorced, and lives alone, with no pets. Her son [name] 69 years old is graduatin with a degree in December 2014. Her daughter [name] 62 is studying Special educational needs teacher in [place].   ADVANCED DIRECTIVES: in place; the patient has named her sister, Osvaldo Shipper, as her healthcare power of attorney. She can be reached at [phone]  HEALTH MAINTENANCE: (Updated November 2014) History  Substance Use Topics  . Smoking status: Never Smoker   . Smokeless tobacco: Never Used  . Alcohol Use: Yes     Comment: rarely     Colonoscopy: Not on file  PAP: Not on file  Bone density: Never  Lipid panel: Not on file  No Known Allergies  Current Outpatient Prescriptions  Medication Sig Dispense Refill  . calcium-vitamin D (OSCAL) 250-125 MG-UNIT per tablet Take 1 tablet by mouth every other day.      . fesoterodine (TOVIAZ) 4 MG TB24  tablet Take 4 mg by mouth daily.      Marland Kitchen ibuprofen (ADVIL,MOTRIN) 200 MG tablet Take 800 mg by mouth as needed. For pain      . ramipril (ALTACE) 10 MG capsule Take 2 capsules by mouth daily.       No current facility-administered medications for this visit.    OBJECTIVE: Middle-aged white woman in no acute distress Filed Vitals:   03/04/13 0919  BP: 139/87  Pulse: 82  Temp: 97.7 F (36.5 C)  Resp: 18     Body mass index is 44.22 kg/(m^2).    ECOG FS: 0 Filed Weights   03/04/13 0919  Weight: 278 lb 1.6 oz (126.145 kg)   Physical Exam: HEENT:  Sclerae anicteric.  Oropharynx clear. Buccal mucosa is pink and moist. NODES:  No cervical or supraclavicular lymphadenopathy palpated.  BREAST EXAM: Right breast is unremarkable. Left breast is status post lumpectomy. No suspicious nodularity or skin changes, and no evidence of local recurrence. Axillae are benign bilaterally, with no palpable lymphadenopathy. LUNGS:  Clear to auscultation bilaterally.  No wheezes or rhonchi. HEART:  Regular rate and rhythm. No murmur  ABDOMEN:  Soft, obese, nontender to palpation.  Positive bowel sounds.  MSK:  No focal spinal tenderness to palpation. Good range of motion in the upper extremities. No joint swelling. EXTREMITIES:  No peripheral edema.   NEURO:  Nonfocal. Well oriented.  Positive affect.     LAB RESULTS: Lab Results  Component Value Date   WBC 6.1 03/04/2013   NEUTROABS 4.1 03/04/2013   HGB 12.9 03/04/2013   HCT 38.8 03/04/2013   MCV 82.1 03/04/2013   PLT 291 03/04/2013      Chemistry      Component Value Date/Time   NA 140 08/16/2012 0818   NA 139 03/12/2012 1431   K 4.2 08/16/2012 0818   K 3.8 03/12/2012 1431   CL 108* 08/16/2012 0818   CL 101 03/12/2012 1431   CO2 23 08/16/2012 0818   CO2 30 03/12/2012 1431   BUN 14.7 08/16/2012 0818   BUN 14 03/12/2012 1431   CREATININE 0.8 08/16/2012 0818   CREATININE 0.80 03/12/2012 1431      Component Value Date/Time   CALCIUM 8.4 08/16/2012  0818   CALCIUM 9.2 03/12/2012 1431   ALKPHOS 60 08/16/2012 0818   AST 23 08/16/2012 0818   ALT 38 08/16/2012 0818   BILITOT 0.89 08/16/2012 0818        STUDIES:  Most recent bilateral mammogram on 12/02/2012 was unremarkable.    ASSESSMENT: 52 y.o. BRCA negative [place] woman   (1)  status post left lumpectomy 03/20/2012 for a pT1a pN0 invasive ductal carcinoma, grade 2, estrogen and progesterone receptor negative, HER-2 positive with a ratio of 2.81 by C[facility], with an MIB-1 of 69%.  (2) biopsy 02/19/2012 of two additional lesions in the Left breast,  Benign  (3)  Status post radiation therapy, followed by observation alone.  PLAN:  Jillisa is still doing very well with regards to her breast cancer, with no clinical evidence of disease recurrence. We will continue to follow her with observation alone. She is due for her next mammogram in August, and we will plan on seeing her sooner after for repeat labs and physical exam. If she is still doing well, we will likely begin seeing her on an annual basis at that time.  Kween voices understanding and agreement with this plan and will contact us with any questions prior to her next appointment.  Tava Peery PA-C     03/04/2013

## 2013-03-04 NOTE — Telephone Encounter (Signed)
appts made and printed. Pt can not come the week of 8/31 because she will be out of town....td

## 2013-11-23 ENCOUNTER — Emergency Department (HOSPITAL_COMMUNITY): Payer: BC Managed Care – PPO

## 2013-11-23 ENCOUNTER — Encounter (HOSPITAL_COMMUNITY): Payer: Self-pay | Admitting: Emergency Medicine

## 2013-11-23 ENCOUNTER — Emergency Department (HOSPITAL_COMMUNITY)
Admission: EM | Admit: 2013-11-23 | Discharge: 2013-11-23 | Disposition: A | Discharge status: Home / Self Care | Payer: BC Managed Care – PPO | Attending: Emergency Medicine | Admitting: Emergency Medicine

## 2013-11-23 DIAGNOSIS — N23 Unspecified renal colic: Secondary | ICD-10-CM | POA: Diagnosis not present

## 2013-11-23 DIAGNOSIS — Z860100 Personal history of colon polyps, unspecified: Secondary | ICD-10-CM | POA: Insufficient documentation

## 2013-11-23 DIAGNOSIS — E663 Overweight: Secondary | ICD-10-CM | POA: Insufficient documentation

## 2013-11-23 DIAGNOSIS — Z79899 Other long term (current) drug therapy: Secondary | ICD-10-CM | POA: Insufficient documentation

## 2013-11-23 DIAGNOSIS — I10 Essential (primary) hypertension: Secondary | ICD-10-CM | POA: Insufficient documentation

## 2013-11-23 DIAGNOSIS — E559 Vitamin D deficiency, unspecified: Secondary | ICD-10-CM | POA: Diagnosis not present

## 2013-11-23 DIAGNOSIS — N201 Calculus of ureter: Secondary | ICD-10-CM | POA: Insufficient documentation

## 2013-11-23 DIAGNOSIS — Z923 Personal history of irradiation: Secondary | ICD-10-CM | POA: Insufficient documentation

## 2013-11-23 DIAGNOSIS — Z853 Personal history of malignant neoplasm of breast: Secondary | ICD-10-CM | POA: Insufficient documentation

## 2013-11-23 DIAGNOSIS — R1031 Right lower quadrant pain: Secondary | ICD-10-CM | POA: Insufficient documentation

## 2013-11-23 DIAGNOSIS — Z8589 Personal history of malignant neoplasm of other organs and systems: Secondary | ICD-10-CM | POA: Insufficient documentation

## 2013-11-23 DIAGNOSIS — R110 Nausea: Secondary | ICD-10-CM | POA: Diagnosis not present

## 2013-11-23 LAB — URINALYSIS, ROUTINE W REFLEX MICROSCOPIC
BILIRUBIN URINE: NEGATIVE
Glucose, UA: NEGATIVE mg/dL
Ketones, ur: NEGATIVE mg/dL
Nitrite: NEGATIVE
PH: 6.5 (ref 5.0–8.0)
Protein, ur: NEGATIVE mg/dL
SPECIFIC GRAVITY, URINE: 1.008 (ref 1.005–1.030)
UROBILINOGEN UA: 0.2 mg/dL (ref 0.0–1.0)

## 2013-11-23 LAB — URINE MICROSCOPIC-ADD ON

## 2013-11-23 MED ORDER — OXYCODONE-ACETAMINOPHEN 5-325 MG PO TABS: 1.0000 | ORAL_TABLET | ORAL | Status: DC | PRN

## 2013-11-23 MED ORDER — TAMSULOSIN HCL 0.4 MG PO CAPS: ORAL_CAPSULE | ORAL | Status: DC

## 2013-11-23 NOTE — Discharge Instructions (Signed)
Strain your urine to catch the stone.   Kidney Stones Kidney stones (urolithiasis) are deposits that form inside your kidneys. The intense pain is caused by the stone moving through the urinary tract. When the stone moves, the ureter goes into spasm around the stone. The stone is usually passed in the urine.  CAUSES   A disorder that makes certain neck glands produce too much parathyroid hormone (primary hyperparathyroidism).  A buildup of uric acid crystals, similar to gout in your joints.  Narrowing (stricture) of the ureter.  A kidney obstruction present at birth (congenital obstruction).  Previous surgery on the kidney or ureters.  Numerous kidney infections. SYMPTOMS   Feeling sick to your stomach (nauseous).  Throwing up (vomiting).  Blood in the urine (hematuria).  Pain that usually spreads (radiates) to the groin.  Frequency or urgency of urination. DIAGNOSIS   Taking a history and physical exam.  Blood or urine tests.  CT scan.  Occasionally, an examination of the inside of the urinary bladder (cystoscopy) is performed. TREATMENT   Observation.  Increasing your fluid intake.  Extracorporeal shock wave lithotripsy--This is a noninvasive procedure that uses shock waves to break up kidney stones.  Surgery may be needed if you have severe pain or persistent obstruction. There are various surgical procedures. Most of the procedures are performed with the use of small instruments. Only small incisions are needed to accommodate these instruments, so recovery time is minimized. The size, location, and chemical composition are all important variables that will determine the proper choice of action for you. Talk to your health care provider to better understand your situation so that you will minimize the risk of injury to yourself and your kidney.  HOME CARE INSTRUCTIONS   Drink enough water and fluids to keep your urine clear or pale yellow. This will help you to  pass the stone or stone fragments.  Strain all urine through the provided strainer. Keep all particulate matter and stones for your health care provider to see. The stone causing the pain may be as small as a grain of salt. It is very important to use the strainer each and every time you pass your urine. The collection of your stone will allow your health care provider to analyze it and verify that a stone has actually passed. The stone analysis will often identify what you can do to reduce the incidence of recurrences.  Only take over-the-counter or prescription medicines for pain, discomfort, or fever as directed by your health care provider.  Make a follow-up appointment with your health care provider as directed.  Get follow-up X-rays if required. The absence of pain does not always mean that the stone has passed. It may have only stopped moving. If the urine remains completely obstructed, it can cause loss of kidney function or even complete destruction of the kidney. It is your responsibility to make sure X-rays and follow-ups are completed. Ultrasounds of the kidney can show blockages and the status of the kidney. Ultrasounds are not associated with any radiation and can be performed easily in a matter of minutes. SEEK MEDICAL CARE IF:  You experience pain that is progressive and unresponsive to any pain medicine you have been prescribed. SEEK IMMEDIATE MEDICAL CARE IF:   Pain cannot be controlled with the prescribed medicine.  You have a fever or shaking chills.  The severity or intensity of pain increases over 18 hours and is not relieved by pain medicine.  You develop a new onset of  abdominal pain.  You feel faint or pass out.  You are unable to urinate. MAKE SURE YOU:   Understand these instructions.  Will watch your condition.  Will get help right away if you are not doing well or get worse. Document Released: 04/03/2005 Document Revised: 12/04/2012 Document Reviewed:  09/04/2012 [facility] Patient Information 2015 [place], [place]. This information is not intended to replace advice given to you by your health care provider. Make sure you discuss any questions you have with your health care provider.  Ureteral Colic (Kidney Stones) Ureteral colic is the result of a condition when kidney stones form inside the kidney. Once kidney stones are formed they may move into the tube that connects the kidney with the bladder (ureter). If this occurs, this condition may cause pain (colic) in the ureter.  CAUSES  Pain is caused by stone movement in the ureter and the obstruction caused by the stone. SYMPTOMS  The pain comes and goes as the ureter contracts around the stone. The pain is usually intense, sharp, and stabbing in character. The location of the pain may move as the stone moves through the ureter. When the stone is near the kidney the pain is usually located in the back and radiates to the belly (abdomen). When the stone is ready to pass into the bladder the pain is often located in the lower abdomen on the side the stone is located. At this location, the symptoms may mimic those of a urinary tract infection with urinary frequency. Once the stone is located here it often passes into the bladder and the pain disappears completely. TREATMENT   Your caregiver will provide you with medicine for pain relief.  You may require specialized follow-up X-rays.  The absence of pain does not always mean that the stone has passed. It may have just stopped moving. If the urine remains completely obstructed, it can cause loss of kidney function or even complete destruction of the involved kidney. It is your responsibility and in your interest that X-rays and follow-ups as suggested by your caregiver are completed. Relief of pain without passage of the stone can be associated with severe damage to the kidney, including loss of kidney function on that side.  If your stone does not pass  on its own, additional measures may be taken by your caregiver to ensure its removal. HOME CARE INSTRUCTIONS   Increase your fluid intake. Water is the preferred fluid since juices containing vitamin C may acidify the urine making it less likely for certain stones (uric acid stones) to pass.  Strain all urine. A strainer will be provided. Keep all particulate matter or stones for your caregiver to inspect.  Take your pain medicine as directed.  Make a follow-up appointment with your caregiver as directed.  Remember that the goal is passage of your stone. The absence of pain does not mean the stone is gone. Follow your caregiver's instructions.  Only take over-the-counter or prescription medicines for pain, discomfort, or fever as directed by your caregiver. SEEK MEDICAL CARE IF:   Pain cannot be controlled with the prescribed medicine.  You have a fever.  Pain continues for longer than your caregiver advises it should.  There is a change in the pain, and you develop chest discomfort or constant abdominal pain.  You feel faint or pass out. MAKE SURE YOU:   Understand these instructions.  Will watch your condition.  Will get help right away if you are not doing well or get worse.  Document Released: 01/11/2005 Document Revised: 07/29/2012 Document Reviewed: 09/28/2010 [facility] Patient Information 2015 [place], [place]. This information is not intended to replace advice given to you by your health care provider. Make sure you discuss any questions you have with your health care provider.

## 2013-11-23 NOTE — ED Provider Notes (Signed)
CSN: [id number]     Arrival date & time 11/23/13  1501 History   First MD Initiated Contact with Patient 11/23/13 1522     Chief Complaint  Patient presents with  . Abdominal Pain     (Consider location/radiation/quality/duration/timing/severity/associated sxs/prior Treatment) Patient is a 53 y.o. Ferguson presenting with abdominal pain. The history is provided by the patient.  Abdominal Pain  Alicia Ferguson is a 53 y.o. Ferguson who developed right lower quadrant abdominal pain, this morning, accompanied with nausea, but no vomiting. The pain was severe, 10 out of 10. The pain has spontaneously improved and is now 3/10, and has migrated to the right flank. She denies fever, cough, chest pain, weakness, or dizziness. She's never had pain like this before. She does have a history of kidney stones, remote. She's had a cholecystectomy, but still has her appendix. No recent sick contacts. She has urinary frequency chronically, which is treated with Toviaz. Today, she has urinary urgency, but no hematuria or dysuria. There are no other known modifying factors.   Past Medical History  Diagnosis Date  . Breast cancer   . PONV (postoperative nausea and vomiting)   . Hypertension     takes Ramipril daily  . Neck pain     bulging disc  . History of colon polyps   . Urinary frequency   . History of kidney stones   . Vitamin D deficiency     takes Oscal every other day  . S/P radiation therapy 05/13/12 - 07/02/12    Left Breast / 50.4 Gy / 28 Fractions: Left Breast Boost/ 10 Gy / 5 Fractions  . Cancer    Past Surgical History  Procedure Laterality Date  . Cholecystectomy  2000  . Neck surgery  2001  . Tubal ligation  1998  . Back surgery  1984  . Ovarian cysts removed    . Blephoplasty      blephoplasty  . Colonoscopy    . Breast lumpectomy with needle localization and axillary sentinel lymph node bx  03/20/2012    Procedure: BREAST LUMPECTOMY WITH NEEDLE LOCALIZATION AND AXILLARY SENTINEL  LYMPH NODE BX;  Surgeon: Haywood Lasso, MD;  Location: [facility];  Service: General;  Laterality: Left;  with removal of skin tags from both breasts.  . Portacath placement  03/20/2012    Procedure: INSERTION PORT-A-CATH;  Surgeon: Haywood Lasso, MD;  Location: [facility];  Service: General;  Laterality: Right;  Right internal jugular  . Port-a-cath removal  04/23/2012    Procedure: MINOR REMOVAL PORT-A-CATH;  Surgeon: Haywood Lasso, MD;  Location: [facility];  Service: General;  Laterality: N/A;   Family History  Problem Relation Age of Onset  . Allergies Mother   . Asthma Mother   . Colon cancer Maternal Grandmother   . Prostate cancer Brother 63   History  Substance Use Topics  . Smoking status: Never Smoker   . Smokeless tobacco: Never Used  . Alcohol Use: Yes     Comment: rarely   OB History   Grav Para Term Preterm Abortions TAB SAB Ect Mult Living   2 2             Obstetric Comments   MENARCHE AGE 28, PARITY AGE 32, [place], G2, MENOPAUSE 2006, NO HRT USE     Review of Systems  Gastrointestinal: Positive for abdominal pain.  All other systems reviewed and are negative.     Allergies  Review of patient's allergies  indicates no known allergies.  Home Medications   Prior to Admission medications   Medication Sig Start Date End Date Taking? Authorizing Provider  calcium-vitamin D (OSCAL) 250-125 MG-UNIT per tablet Take 1 tablet by mouth every other day.   Yes Historical Provider, MD  cholecalciferol (VITAMIN D) 1000 UNITS tablet Take 1,000 Units by mouth every other day.   Yes Historical Provider, MD  fesoterodine (TOVIAZ) 4 MG TB24 tablet Take 4 mg by mouth daily.   Yes Historical Provider, MD  ibuprofen (ADVIL,MOTRIN) 200 MG tablet Take 800 mg by mouth every 8 (eight) hours as needed for moderate pain. For pain   Yes Historical Provider, MD  ramipril (ALTACE) 10 MG capsule Take 20 capsules by mouth daily.  08/18/10  Yes Historical Provider, MD   oxyCODONE-acetaminophen (PERCOCET) 5-325 MG per tablet Take 1 tablet by mouth every 4 (four) hours as needed. 11/23/13   Richarda Blade, MD  tamsulosin (FLOMAX) 0.4 MG CAPS capsule 1 q HS to aid stone passage 11/23/13   Richarda Blade, MD   BP 164/99  Pulse 73  Temp(Src) 97.6 F (36.4 C) (Oral)  Resp 17  Ht 5\' 6"  (1.676 m)  Wt 275 lb (124.739 kg)  BMI 44.41 kg/m2  SpO2 96% Physical Exam  Nursing note and vitals reviewed. Constitutional: She is oriented to person, place, and time. She appears well-developed.  Overweight  HENT:  Head: Normocephalic and atraumatic.  Eyes: Conjunctivae and EOM are normal. Pupils are equal, round, and reactive to light.  Neck: Normal range of motion and phonation normal. Neck supple.  Cardiovascular: Normal rate, regular rhythm and intact distal pulses.   Pulmonary/Chest: Effort normal and breath sounds normal. She exhibits no tenderness.  Abdominal: Soft. She exhibits no distension and no mass. There is tenderness (very minimal right lower quadrant tenderness.). There is no rebound and no guarding.  Genitourinary:  No costovertebral angle tenderness to percussion  Musculoskeletal: Normal range of motion.  Neurological: She is alert and oriented to person, place, and time. She exhibits normal muscle tone.  Skin: Skin is warm and dry.  Psychiatric: She has a normal mood and affect. Her behavior is normal. Judgment and thought content normal.    ED Course  Procedures (including critical care time)  Medications - No data to display  Patient Vitals for the past 24 hrs:  BP Temp Temp src Pulse Resp SpO2 Height Weight  11/23/13 1607 - - - - - - 5\' 6"  (1.676 m) 275 lb (124.739 kg)  11/23/13 1606 164/99 mmHg - - 73 - - - -  11/23/13 1510 174/103 mmHg 97.6 F (36.4 C) Oral 76 17 96 % - -    4:37 PM Reevaluation with update and discussion. After initial assessment and treatment, an updated evaluation reveals she remains comfortable. Soniyah Mcglory L     Labs Review Labs Reviewed  URINALYSIS, ROUTINE W REFLEX MICROSCOPIC - Abnormal; Notable for the following:    Hgb urine dipstick LARGE (*)    Leukocytes, UA TRACE (*)    All other components within normal limits  URINE MICROSCOPIC-ADD ON    Imaging Review Ct Abdomen Pelvis Wo Contrast  11/23/2013   CLINICAL DATA:  RIGHT flank pain.  EXAM: CT ABDOMEN AND PELVIS WITHOUT CONTRAST  TECHNIQUE: Multidetector CT imaging of the abdomen and pelvis was performed following the standard protocol without IV contrast.  COMPARISON:  None.  FINDINGS: No intrarenal calculi on the RIGHT but moderate hydronephrosis and hydroureter is present down to the level  of the distal ureter where a 3 mm calculus is observed lodged just above the ureterovesical junction. The bladder is unremarkable.  Again, within limits of unenhanced technique, remaining visualized upper abdomen unremarkable. Visualized extreme lung bases clear. Advanced hepatic steatosis. 6.0 x 6.4 cm water density cyst within the falciform ligament. Prior cholecystectomy. Increased body habitus. Colonic diverticulosis without diverticulitis.  No appendiceal inflammation. 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  IMPRESSION: RIGHT hydronephrosis and hydroureter secondary to a 3 mm calculus in the distal ureter just above the UVJ.  LEFT nephrolithiasis.  Advanced hepatic steatosis.  6 cm hepatic cyst.   Electronically Signed   By: Rolla Flatten M.D.   On: 11/23/2013 16:06     EKG Interpretation None      MDM   Final diagnoses:  Ureteral colic  Ureteral stone    Flank pain, related to small kidney stone that is low and is likely to pass with an for 72 hours. No evidence for urinary tract infection.  Nursing Notes Reviewed/ Care Coordinated Applicable Imaging Reviewed Interpretation of Laboratory Data incorporated into ED treatment  The patient appears reasonably screened and/or stabilized for discharge and I doubt any other  medical condition or other [facility] requiring further screening, evaluation, or treatment in the ED at this time prior to discharge.  Plan: Home Medications- Percocet, Flomax; Home Treatments- strain urine, rest; return here if the recommended treatment, does not improve the symptoms; Recommended follow up- urology followup in one week     Richarda Blade, MD 11/23/13 [phone]

## 2013-11-23 NOTE — ED Notes (Signed)
Patient transported to CT 

## 2013-11-23 NOTE — ED Notes (Signed)
She c/o rlq area pain and nausea without vomiting today.  Saw physician at [facility] clinic, who advised her to come here.  She is in no distress.

## 2013-11-23 NOTE — ED Notes (Signed)
Strainer given  

## 2013-12-04 ENCOUNTER — Ambulatory Visit
Admission: RE | Admit: 2013-12-04 | Discharge: 2013-12-04 | Disposition: A | Discharge status: Home / Self Care | Payer: BC Managed Care – PPO | Source: Ambulatory Visit | Attending: Physician Assistant | Admitting: Physician Assistant

## 2013-12-04 DIAGNOSIS — Z853 Personal history of malignant neoplasm of breast: Secondary | ICD-10-CM

## 2013-12-08 ENCOUNTER — Other Ambulatory Visit (HOSPITAL_BASED_OUTPATIENT_CLINIC_OR_DEPARTMENT_OTHER): Payer: BC Managed Care – PPO

## 2013-12-08 DIAGNOSIS — C50112 Malignant neoplasm of central portion of left female breast: Secondary | ICD-10-CM

## 2013-12-08 DIAGNOSIS — C50519 Malignant neoplasm of lower-outer quadrant of unspecified female breast: Secondary | ICD-10-CM

## 2013-12-08 LAB — CBC WITH DIFFERENTIAL/PLATELET
BASO%: 0.9 % (ref 0.0–2.0)
Basophils Absolute: 0.1 10*3/uL (ref 0.0–0.1)
EOS ABS: 0.1 10*3/uL (ref 0.0–0.5)
EOS%: 2.1 % (ref 0.0–7.0)
HCT: 39 % (ref 34.8–46.6)
HEMOGLOBIN: 12.7 g/dL (ref 11.6–15.9)
LYMPH#: 1.7 10*3/uL (ref 0.9–3.3)
LYMPH%: 28.4 % (ref 14.0–49.7)
MCH: 26.9 pg (ref 25.1–34.0)
MCHC: 32.7 g/dL (ref 31.5–36.0)
MCV: 82.4 fL (ref 79.5–101.0)
MONO#: 0.5 10*3/uL (ref 0.1–0.9)
MONO%: 8.3 % (ref 0.0–14.0)
NEUT%: 60.3 % (ref 38.4–76.8)
NEUTROS ABS: 3.7 10*3/uL (ref 1.5–6.5)
Platelets: 314 10*3/uL (ref 145–400)
RBC: 4.74 10*6/uL (ref 3.70–5.45)
RDW: 14.8 % — AB (ref 11.2–14.5)
WBC: 6.1 10*3/uL (ref 3.9–10.3)

## 2013-12-08 LAB — COMPREHENSIVE METABOLIC PANEL (CC13)
ALBUMIN: 3.6 g/dL (ref 3.5–5.0)
ALK PHOS: 52 U/L (ref 40–150)
ALT: 41 U/L (ref 0–55)
AST: 27 U/L (ref 5–34)
Anion Gap: 8 mEq/L (ref 3–11)
BUN: 15.8 mg/dL (ref 7.0–26.0)
CALCIUM: 8.8 mg/dL (ref 8.4–10.4)
CHLORIDE: 108 meq/L (ref 98–109)
CO2: 23 mEq/L (ref 22–29)
Creatinine: 0.7 mg/dL (ref 0.6–1.1)
Glucose: 113 mg/dl (ref 70–140)
POTASSIUM: 4.1 meq/L (ref 3.5–5.1)
SODIUM: 140 meq/L (ref 136–145)
TOTAL PROTEIN: 6.7 g/dL (ref 6.4–8.3)
Total Bilirubin: 0.99 mg/dL (ref 0.20–1.20)

## 2013-12-10 ENCOUNTER — Telehealth: Payer: Self-pay | Admitting: Oncology

## 2013-12-10 NOTE — Telephone Encounter (Signed)
pt cld to r/s a appt -r/s & gave pt new time & date

## 2013-12-15 ENCOUNTER — Ambulatory Visit: Payer: BC Managed Care – PPO | Admitting: Oncology

## 2013-12-19 ENCOUNTER — Ambulatory Visit: Payer: BC Managed Care – PPO | Admitting: Radiation Oncology

## 2013-12-23 ENCOUNTER — Ambulatory Visit: Payer: BC Managed Care – PPO | Admitting: Oncology

## 2013-12-26 ENCOUNTER — Ambulatory Visit: Payer: BC Managed Care – PPO | Admitting: Radiation Oncology

## 2014-01-09 ENCOUNTER — Ambulatory Visit: Payer: BC Managed Care – PPO | Admitting: Radiation Oncology

## 2014-02-04 ENCOUNTER — Ambulatory Visit
Admission: RE | Admit: 2014-02-04 | Discharge: 2014-02-04 | Disposition: A | Discharge status: Home / Self Care | Payer: BC Managed Care – PPO | Source: Ambulatory Visit | Attending: Radiation Oncology | Admitting: Radiation Oncology

## 2014-02-04 ENCOUNTER — Telehealth: Payer: Self-pay | Admitting: *Deleted

## 2014-02-04 ENCOUNTER — Encounter: Payer: Self-pay | Admitting: Radiation Oncology

## 2014-02-04 VITALS — BP 127/91 | HR 79 | Temp 98.6°F | Ht 66.0 in | Wt 272.2 lb

## 2014-02-04 DIAGNOSIS — C50112 Malignant neoplasm of central portion of left female breast: Secondary | ICD-10-CM

## 2014-02-04 NOTE — Progress Notes (Signed)
Alicia Ferguson here today for reassessment s/p radiation therapy to her left breast which completed on 07/02/12.  She denies any pain in her breast , but reports that since her surgery she has experienced intermittent C/o catch in her left lateral rib region when she reaches for her car door, leaning down to tie her shoes. It actually ":takes my breath" when this occurs.   She recently had left knee surgery.

## 2014-02-04 NOTE — Telephone Encounter (Signed)
Called patient to inform of mammogram on 12-07-14 @ 10 am @ [facility], spoke with patient and she is aware of this test

## 2014-02-04 NOTE — Progress Notes (Signed)
  Radiation Oncology         [phone] ________________________________  Name: Alicia Ferguson MRN: [id number]  Date: 02/04/2014  DOB: 1960/11/01  Follow-Up Visit Note  Outpatient  CC:  Melinda Crutch, MD  Haywood Lasso, MD  Diagnosis and Prior Radiotherapy:    ICD-9-CM ICD-10-CM   1. 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  Her mammograms on 8-20 were BIRADS 2.  She has stable sharp pain that lasts for 2-3 seconds in lateral left rib cage when she reaches for her car door, leaning down to tie her shoes. It actually "takes my breath" when this occurs. Not constant.  Not affecting sleep.  She recently had left knee surgery.                        ALLERGIES:  has No Known Allergies.  Meds: Current Outpatient Prescriptions  Medication Sig Dispense Refill  . calcium-vitamin D (OSCAL) 250-125 MG-UNIT per tablet Take 1 tablet by mouth every other day.      . cholecalciferol (VITAMIN D) 1000 UNITS tablet Take 1,000 Units by mouth every other day.      . fesoterodine (TOVIAZ) 4 MG TB24 tablet Take 4 mg by mouth daily.      Marland Kitchen ibuprofen (ADVIL,MOTRIN) 200 MG tablet Take 800 mg by mouth every 8 (eight) hours as needed for moderate pain. For pain      . ramipril (ALTACE) 10 MG capsule Take 20 capsules by mouth daily.        No current facility-administered medications for this encounter.    Physical Findings: The patient is in no acute distress. Patient is alert and oriented.  height is $RemoveB'5\' 6"'oLPwuOpH$  (1.676 m) and weight is 272 lb 3.2 oz (123.469 kg). Her temperature is 98.6 F (37 C). Her blood pressure is 127/91 and her pulse is 79. Marland Kitchen   NECK: No cervical or supraclavicular lymphadenopathy BREASTS: No lesions palpated in breasts or axillary regions bilaterally. No  tenderness to palpation of left rib cage laterally   Lab Findings: Lab Results  Component Value Date   WBC 6.1 12/08/2013   HGB 12.7 12/08/2013   HCT 39.0 12/08/2013   MCV 82.4 12/08/2013   PLT 314 12/08/2013    Radiographic Findings: See above  Impression/Plan: NED  1) left rib pain is episodic and not consistent with bone mets.  I offered an xray to confirm, but think it is unlikely to show anything.  She will decline xray and inform me if sx worsen  2) f/u in 1 yr, annual mammogram in August  3) Diet and exercise discussed for best health.    _____________________________________   Eppie Gibson, MD

## 2014-02-16 ENCOUNTER — Ambulatory Visit (HOSPITAL_BASED_OUTPATIENT_CLINIC_OR_DEPARTMENT_OTHER): Payer: BC Managed Care – PPO | Admitting: Oncology

## 2014-02-16 ENCOUNTER — Telehealth: Payer: Self-pay | Admitting: Oncology

## 2014-02-16 ENCOUNTER — Encounter: Payer: Self-pay | Admitting: Radiation Oncology

## 2014-02-16 VITALS — BP 141/89 | HR 87 | Temp 98.4°F | Resp 18 | Ht 66.0 in | Wt 279.4 lb

## 2014-02-16 DIAGNOSIS — Z923 Personal history of irradiation: Secondary | ICD-10-CM

## 2014-02-16 DIAGNOSIS — C50912 Malignant neoplasm of unspecified site of left female breast: Secondary | ICD-10-CM

## 2014-02-16 DIAGNOSIS — Z853 Personal history of malignant neoplasm of breast: Secondary | ICD-10-CM

## 2014-02-16 NOTE — Telephone Encounter (Signed)
per pof to sch pt appt-gave pt copy of sch °

## 2014-02-16 NOTE — Progress Notes (Signed)
ID: Alicia Ferguson   DOB: 1960-11-22  MR#: [id number]  CSN#:[id number]  PCP: Alicia Deputy MD GYN:  Alicia Coho MD OTHER MD: Alicia Reese, MD; Alicia Sewer, MD  CHIEF COMPLAINT:  Left Breast Cancer  CURRENT TREATMENT: Observation   HISTORY OF PRESENT ILLNESS: From the original intake note:  Alicia Ferguson [facility]. There were some calcifications in the left breast and a possible mass in the right breast, leading to diagnostic mammography 01/29/2012. There were 2 areas of calcification in the left breast, without associated mass or distortion. In the right breast there was a 3 cm oval area of increased density. Neither of these abnormalities were palpable. Ultrasound was noninformative. On the same day, biopsy of one of the left breast lesions was obtained, and showed ([id number]) invasive ductal carcinoma, grade 1, estrogen and progesterone receptor negative, with an MIB-1 of 51%, and an equivocal HER-2 at 2.02  On 02/03/2012 the patient underwent bilateral breast MRI. In the left breast there was an irregular enhancing mass measuring 2.5 cm, which is the one that had been biopsied. In additional, there was a 7 mm mass anterior to the previously biopsied mass and a third mass in the lower outer quadrant of the left breast. The right breast was unremarkable, and there was no axillary or internal mammary adenopathy noted.  Biopsy of the lateral breast mass on 02/12/2012 showed only fibrocystic changes. ([id number]). The patient's subsequent history is as detailed below.  INTERVAL HISTORY:  Alicia Ferguson returns today for followup of her left breast cancer. The interval history is generally unremarkable she is doing a lot of traveling, partly because of her work, and partly because her daughter worked as an Sales executive in [place] and she visited her there. Since her last visit here Alicia Ferguson also had surgery to her left knee under Dr. French Ferguson. That went well. She is  Alicia Ferguson without difficulty and already is able to walk a bit better than before.  REVIEW OF SYSTEMS: Alicia Ferguson does have aches here and there particularly in the left flank at the brow line. This occurs with certain reaching movements. However it does not occur reproducibly. She says it happens perhaps "1 in 50 times" that she reaches for something laterally. Occasionally her ankles swell. This again is not a new problem. She gets short of breath when climbing stairs but she can get to the top of the stairs without having to stop. She continues to have mild heartburn issues. Otherwise a detailed review of systems today was stable  PAST MEDICAL HISTORY: Past Medical History  Diagnosis Date  . Breast cancer   . PONV (postoperative nausea and vomiting)   . Hypertension     takes Ramipril daily  . Neck pain     bulging disc  . History of colon polyps   . Urinary frequency   . History of kidney stones   . Vitamin D deficiency     takes Oscal every other day  . S/P radiation therapy 05/13/12 - 07/02/12    Left Breast / 50.4 Gy / 28 Fractions: Left Breast Boost/ 10 Gy / 5 Fractions  . Cancer     PAST SURGICAL HISTORY: Past Surgical History  Procedure Laterality Date  . Cholecystectomy  2000  . Neck surgery  2001  . Tubal ligation  1998  . Back surgery  1984  . Ovarian cysts removed    . Blephoplasty      blephoplasty  .  Colonoscopy    . Breast lumpectomy with needle localization and axillary sentinel lymph node bx  03/20/2012    Procedure: BREAST LUMPECTOMY WITH NEEDLE LOCALIZATION AND AXILLARY SENTINEL LYMPH NODE BX;  Surgeon: Alicia Lasso, MD;  Location: [facility];  Service: General;  Laterality: Left;  with removal of skin tags from both breasts.  . Portacath placement  03/20/2012    Procedure: INSERTION PORT-A-CATH;  Surgeon: Alicia Lasso, MD;  Location: [facility];  Service: General;  Laterality: Right;  Right internal jugular  . Port-a-cath removal  04/23/2012    Procedure: MINOR  REMOVAL PORT-A-CATH;  Surgeon: Alicia Lasso, MD;  Location: [facility];  Service: General;  Laterality: N/A;    FAMILY HISTORY Family History  Problem Relation Age of Onset  . Allergies Mother   . Asthma Mother   . Colon cancer Maternal Grandmother   . Prostate cancer Brother 52  the patient's father died at the age of 61 from a ruptured brain aneurysm. The patient's mother died of "old age" at 90. The patient has one brother and one sister. The patient's maternal grandmother was diagnosed with colon cancer in her 78s. There is no history of breast or ovarian cancer in the family.  GYNECOLOGIC HISTORY: Menarche age 30, first live birth age 26. Menopause 2006. She is GX P2. She never took hormone replacement  SOCIAL HISTORY:  (Updated November 2014) Alicia Ferguson works as a Engineer, building services for [employer]. She is divorced, and lives alone, with no pets. Her son Alicia Ferguson 43 years old is graduatin with a degree in December 2014. Her daughter Alicia Ferguson 75 is studying Warden/ranger in [place].   ADVANCED DIRECTIVES: in place; the patient has named her sister, Alicia Ferguson, as her healthcare power of attorney. She can be reached at [place]: (Updated November 2014) History  Substance Use Topics  . Smoking status: Never Smoker   . Smokeless tobacco: Never Used  . Alcohol Use: Yes     Comment: rarely     Colonoscopy: Not on file  PAP: Not on file  Bone density: Never  Lipid panel: Not on file  No Known Allergies  Current Outpatient Prescriptions  Medication Sig Dispense Refill  . calcium-vitamin D (OSCAL) 250-125 MG-UNIT per tablet Take 1 tablet by mouth every other day.    . cholecalciferol (VITAMIN D) 1000 UNITS tablet Take 1,000 Units by mouth every other day.    . fesoterodine (TOVIAZ) 4 MG TB24 tablet Take 4 mg by mouth daily.    Marland Kitchen ibuprofen (ADVIL,MOTRIN) 200 MG tablet Take 800 mg by mouth every 8 (eight) hours as needed for  moderate pain. For pain    . ramipril (ALTACE) 10 MG capsule Take 20 capsules by mouth daily.      No current facility-administered medications for this visit.    OBJECTIVE: Middle-aged white woman who appears well Filed Vitals:   02/16/14 1544  BP: 141/89  Pulse: 87  Temp: 98.4 F (36.9 C)  Resp: 18     Body mass index is 45.12 kg/(m^2).    ECOG FS: 1 Filed Weights   02/16/14 1544  Weight: 279 lb 6.4 oz (126.735 kg)   Sclerae unicteric, pupils equal and reactive Oropharynx clear, teeth in good repair No cervical or supraclavicular adenopathy Lungs no rales or rhonchi Heart regular rate and rhythm Abd soft, nontender, positive bowel sounds MSK no focal spinal tenderness, no upper extremity lymphedema Neuro: nonfocal, well oriented, positive affect Breasts: the  right breast is unremarkable. The left breast is status post lumpectomy and radiation. There is no evidence of local recurrence. The left axilla is benign.   LAB RESULTS: Lab Results  Component Value Date   WBC 6.1 12/08/2013   NEUTROABS 3.7 12/08/2013   HGB 12.7 12/08/2013   HCT 39.0 12/08/2013   MCV 82.4 12/08/2013   PLT 314 12/08/2013      Chemistry      Component Value Date/Time   NA 140 12/08/2013 0827   NA 139 03/12/2012 1431   K 4.1 12/08/2013 0827   K 3.8 03/12/2012 1431   CL 108* 08/16/2012 0818   CL 101 03/12/2012 1431   CO2 23 12/08/2013 0827   CO2 30 03/12/2012 1431   BUN 15.8 12/08/2013 0827   BUN 14 03/12/2012 1431   CREATININE 0.7 12/08/2013 0827   CREATININE 0.80 03/12/2012 1431      Component Value Date/Time   CALCIUM 8.8 12/08/2013 0827   CALCIUM 9.2 03/12/2012 1431   ALKPHOS 52 12/08/2013 0827   AST 27 12/08/2013 0827   ALT 41 12/08/2013 0827   BILITOT 0.99 12/08/2013 0827        STUDIES:  DIGITAL DIAGNOSTIC LEFT MAMMOGRAM WITH CAD  COMPARISON: December 02, 2012, April 30, 2012, March 20, 2012, December 01, 2011  ACR Breast Density Category b: There are scattered  areas of fibroglandular density.  FINDINGS: Cc and MLO views of bilateral breasts, spot tangential view of the left breast are submitted. Stable postsurgical changes identified within the left breast. No suspicious abnormalities identified bilaterally.  Mammographic images were processed with CAD.  IMPRESSION: Benign findings.  RECOMMENDATION: Recommend bilateral diagnostic mammogram in 1 year.  I have discussed the findings and recommendations with the patient. Results were also provided in writing at the conclusion of the visit. If applicable, a reminder letter will be sent to the patient regarding the next appointment.  BI-RADS CATEGORY 2: Benign Finding(s)   Electronically Signed  By: Abelardo Diesel M.D.  On: 12/04/2013 13:01    ASSESSMENT: 53 y.o. BRCA negative  woman   (1)  status post left lumpectomy 03/20/2012 for a pT1a pN0 invasive ductal carcinoma, grade 2, estrogen and progesterone receptor negative, HER-2 positive with a ratio of 2.81 by CISH, with an MIB-1 of 69%.  (2) biopsy 02/19/2012 of two additional lesions in the Left breast,  Benign  (3)  Status post radiation therapy, followed by observation alone.  PLAN:  Chistine is doing fine from a breast cancer point of view, now just about 2 years out from her definitive surgery, with no evidence of disease recurrence. She understands that for estrogen receptor negative cancers, recurrence, if it is going to occur, tends to recur early.  For that reason I am comfortable broadening the follow-up interval to once a year.she will see her primary care physician, Dr. Jake Michaelis, sometime in the summer. I will see her again next November and then for an additional 2 years. The plan is for her to "graduate" at the 5 year mark.  I think the pain she is having oh occasionally in her left flank when she stretches her arm in a peculiar way is just going to be due to postoperative scarring. She might be able to  improve on that by doing some stretching exercises which we discussed. I don't think it requires any particular evaluation.  Jenessa has a good understanding of the overall plan. She agrees with it. She knows the goal of treatment in her case  with is cure. She will call with any problems that may develop before her next visit here. Chauncey Cruel, MD      02/16/2014

## 2014-10-07 ENCOUNTER — Telehealth: Payer: Self-pay | Admitting: Oncology

## 2014-10-07 NOTE — Telephone Encounter (Signed)
Was not able to reach patient. Mailed calendar for November due to MD schedule change. Moved to earlier on 11/07.

## 2014-11-02 ENCOUNTER — Other Ambulatory Visit: Payer: Self-pay | Admitting: Gastroenterology

## 2014-12-22 ENCOUNTER — Other Ambulatory Visit: Payer: Self-pay | Admitting: Radiation Oncology

## 2014-12-22 ENCOUNTER — Ambulatory Visit
Admission: RE | Admit: 2014-12-22 | Discharge: 2014-12-22 | Disposition: A | Discharge status: Home / Self Care | Payer: BLUE CROSS/BLUE SHIELD | Source: Ambulatory Visit | Attending: Radiation Oncology | Admitting: Radiation Oncology

## 2014-12-22 DIAGNOSIS — C50112 Malignant neoplasm of central portion of left female breast: Secondary | ICD-10-CM

## 2015-01-22 ENCOUNTER — Ambulatory Visit
Admission: RE | Admit: 2015-01-22 | Payer: BLUE CROSS/BLUE SHIELD | Source: Ambulatory Visit | Admitting: Radiation Oncology

## 2015-02-12 ENCOUNTER — Encounter: Payer: Self-pay | Admitting: Radiation Oncology

## 2015-02-12 ENCOUNTER — Other Ambulatory Visit (HOSPITAL_BASED_OUTPATIENT_CLINIC_OR_DEPARTMENT_OTHER): Payer: Managed Care, Other (non HMO)

## 2015-02-12 ENCOUNTER — Ambulatory Visit
Admission: RE | Admit: 2015-02-12 | Discharge: 2015-02-12 | Disposition: A | Discharge status: Home / Self Care | Payer: BLUE CROSS/BLUE SHIELD | Source: Ambulatory Visit | Attending: Radiation Oncology | Admitting: Radiation Oncology

## 2015-02-12 VITALS — BP 135/80 | HR 78 | Temp 98.0°F | Ht 66.0 in | Wt 280.6 lb

## 2015-02-12 DIAGNOSIS — Z853 Personal history of malignant neoplasm of breast: Secondary | ICD-10-CM | POA: Diagnosis not present

## 2015-02-12 DIAGNOSIS — Z923 Personal history of irradiation: Secondary | ICD-10-CM

## 2015-02-12 DIAGNOSIS — C50112 Malignant neoplasm of central portion of left female breast: Secondary | ICD-10-CM

## 2015-02-12 DIAGNOSIS — C50912 Malignant neoplasm of unspecified site of left female breast: Secondary | ICD-10-CM

## 2015-02-12 LAB — CBC WITH DIFFERENTIAL/PLATELET
BASO%: 0.7 % (ref 0.0–2.0)
BASOS ABS: 0.1 10*3/uL (ref 0.0–0.1)
EOS%: 1.3 % (ref 0.0–7.0)
Eosinophils Absolute: 0.1 10*3/uL (ref 0.0–0.5)
HCT: 37.6 % (ref 34.8–46.6)
HGB: 12.4 g/dL (ref 11.6–15.9)
LYMPH%: 24.5 % (ref 14.0–49.7)
MCH: 27.3 pg (ref 25.1–34.0)
MCHC: 32.9 g/dL (ref 31.5–36.0)
MCV: 82.9 fL (ref 79.5–101.0)
MONO#: 0.8 10*3/uL (ref 0.1–0.9)
MONO%: 10 % (ref 0.0–14.0)
NEUT#: 4.8 10*3/uL (ref 1.5–6.5)
NEUT%: 63.5 % (ref 38.4–76.8)
PLATELETS: 327 10*3/uL (ref 145–400)
RBC: 4.54 10*6/uL (ref 3.70–5.45)
RDW: 14.4 % (ref 11.2–14.5)
WBC: 7.5 10*3/uL (ref 3.9–10.3)
lymph#: 1.8 10*3/uL (ref 0.9–3.3)

## 2015-02-12 LAB — COMPREHENSIVE METABOLIC PANEL (CC13)
ALT: 34 U/L (ref 0–55)
AST: 25 U/L (ref 5–34)
Albumin: 3.4 g/dL — ABNORMAL LOW (ref 3.5–5.0)
Alkaline Phosphatase: 55 U/L (ref 40–150)
Anion Gap: 6 mEq/L (ref 3–11)
BUN: 12.4 mg/dL (ref 7.0–26.0)
CHLORIDE: 110 meq/L — AB (ref 98–109)
CO2: 27 mEq/L (ref 22–29)
Calcium: 8.9 mg/dL (ref 8.4–10.4)
Creatinine: 0.8 mg/dL (ref 0.6–1.1)
EGFR: 79 mL/min/{1.73_m2} — ABNORMAL LOW (ref >90)
GLUCOSE: 96 mg/dL (ref 70–140)
POTASSIUM: 3.9 meq/L (ref 3.5–5.1)
Sodium: 142 mEq/L (ref 136–145)
Total Bilirubin: 0.83 mg/dL (ref 0.20–1.20)
Total Protein: 6.4 g/dL (ref 6.4–8.3)

## 2015-02-12 NOTE — Progress Notes (Signed)
Alicia Ferguson here for reassessment s/p xrt to her left breast.  She denies any pain in the prior tx field.  She reports a dimple like area on the left breast at her surgical site.    BP 135/80 mmHg  Pulse 78  Temp(Src) 98 F (36.7 C)  Ht 5\' 6"  (1.676 m)  Wt 280 lb 9.6 oz (127.279 kg)  BMI 45.31 kg/m2

## 2015-02-12 NOTE — Progress Notes (Signed)
  Radiation Oncology         [phone] ________________________________  Name: Alicia Ferguson MRN: [id number]  Date: 02/12/2015  DOB: 01-06-1961  Follow-Up Visit Note  Outpatient  CC:  Melinda Crutch, MD  Neldon Mc, MD  Diagnosis and Prior Radiotherapy:    ICD-9-CM ICD-10-CM   1. 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  Her mammograms on 12/22/14 were BIRADS 2.  She denies any pain in the prior txt field. She reports a dimple like area on the left breast at her surgical site.                        ALLERGIES:  has No Known Allergies.  Meds: Current Outpatient Prescriptions  Medication Sig Dispense Refill  . calcium-vitamin D (OSCAL) 250-125 MG-UNIT per tablet Take 1 tablet by mouth every other day.    . cholecalciferol (VITAMIN D) 1000 UNITS tablet Take 1,000 Units by mouth every other day.    . fesoterodine (TOVIAZ) 4 MG TB24 tablet Take 4 mg by mouth daily.    Marland Kitchen ibuprofen (ADVIL,MOTRIN) 200 MG tablet Take 800 mg by mouth every 8 (eight) hours as needed for moderate pain. For pain    . ramipril (ALTACE) 10 MG capsule Take 20 capsules by mouth daily.      No current facility-administered medications for this encounter.    Physical Findings: The patient is in no acute distress. Patient is alert and oriented.  height is _0  (1.676 m) and weight is 280 lb 9.6 oz (127.279 kg). Her temperature is 98 F (36.7 C). Her blood pressure is 135/80 and her pulse is 78.    No palpable masses in either breast nor in the axillary regions. No palpable cervical or supraclavicular lesions.  Lab Findings: Lab Results  Component Value Date   WBC 7.5 02/12/2015   HGB 12.4  02/12/2015   HCT 37.6 02/12/2015   MCV 82.9 02/12/2015   PLT 327 02/12/2015    Radiographic Findings: Her mammograms on 12/22/14 were BIRADS 2.  Impression/Plan: NED  I encouraged her to continue with yearly mammography and followup with medical oncology. I will see her back on an as-needed basis. I have encouraged her to call if she has any issues or concerns in the future. I wished her the very best.  Pt plans to move to [place], [place] in the near future. She will talk to Dr. Jana Hakim about possibly transferring her care there.  This document serves as a record of services personally performed by Eppie Gibson, MD. It was created on her behalf by Darcus Austin, a trained medical scribe. The creation of this record is based on the scribe's personal observations and the provider's statements to them. This document has been checked and approved by the attending provider. _____________________________________   Eppie Gibson, MD

## 2015-02-15 ENCOUNTER — Other Ambulatory Visit: Payer: BC Managed Care – PPO

## 2015-02-22 ENCOUNTER — Ambulatory Visit: Payer: BC Managed Care – PPO | Admitting: Oncology

## 2015-02-22 ENCOUNTER — Ambulatory Visit (HOSPITAL_BASED_OUTPATIENT_CLINIC_OR_DEPARTMENT_OTHER): Payer: Managed Care, Other (non HMO) | Admitting: Oncology

## 2015-02-22 VITALS — BP 153/85 | HR 81 | Temp 98.1°F | Resp 18 | Ht 66.0 in | Wt 279.2 lb

## 2015-02-22 DIAGNOSIS — C50112 Malignant neoplasm of central portion of left female breast: Secondary | ICD-10-CM

## 2015-02-22 DIAGNOSIS — Z853 Personal history of malignant neoplasm of breast: Secondary | ICD-10-CM | POA: Diagnosis not present

## 2015-02-22 NOTE — Progress Notes (Signed)
ID: Alicia Ferguson   DOB: 1960-07-17  MR#: [id number]  CSN#:[id number]  PCP: Philipp Deputy MD GYN:  SUOsborn Coho MD OTHER MD: Crissie Reese, MD; Danton Sewer, MD  CHIEF COMPLAINT:  Left Breast Cancer  CURRENT TREATMENT: Observation   HISTORY OF PRESENT ILLNESS: From the original intake note:  Alicia Ferguson had bilateral screening mammography at the [facility] Center 12/01/2011. There were some calcifications in the left breast and a possible mass in the right breast, leading to diagnostic mammography 01/29/2012. There were 2 areas of calcification in the left breast, without associated mass or distortion. In the right breast there was a 3 cm oval area of increased density. Neither of these abnormalities were palpable. Ultrasound was noninformative. On the same day, biopsy of one of the left breast lesions was obtained, and showed ([id number]) invasive ductal carcinoma, grade 1, estrogen and progesterone receptor negative, with an MIB-1 of 51%, and an equivocal HER-2 at 2.02  On 02/03/2012 the patient underwent bilateral breast MRI. In the left breast there was an irregular enhancing mass measuring 2.5 cm, which is the one that had been biopsied. In additional, there was a 7 mm mass anterior to the previously biopsied mass and a third mass in the lower outer quadrant of the left breast. The right breast was unremarkable, and there was no axillary or internal mammary adenopathy noted.  Biopsy of the lateral breast mass on 02/12/2012 showed only fibrocystic changes. ([id number]). The patient's subsequent history is as detailed below.  INTERVAL HISTORY:  Alicia Ferguson returns today for followup of her early stage left breast cancer. Since her last visit here she has changed jobs, currently working for [employer]. She has sold her home and has bothered a home in [facility]). She would like to move her medical care there.  REVIEW OF SYSTEMS: Alicia Ferguson is not exercising regularly. Of course she is  doing more physical work now because of the move. She has problems with Achilles tendinitis which limits her ability to walk. She does not belong to a gym. Aside from these issues a detailed review of systems today was noncontributory  PAST MEDICAL HISTORY: Past Medical History  Diagnosis Date  . Breast cancer ([place])   . PONV (postoperative nausea and vomiting)   . Hypertension     takes Ramipril daily  . Neck pain     bulging disc  . History of colon polyps   . Urinary frequency   . History of kidney stones   . Vitamin D deficiency     takes Oscal every other day  . S/P radiation therapy 05/13/12 - 07/02/12    Left Breast / 50.4 Gy / 28 Fractions: Left Breast Boost/ 10 Gy / 5 Fractions  . Cancer [facility])     PAST SURGICAL HISTORY: Past Surgical History  Procedure Laterality Date  . Cholecystectomy  2000  . Neck surgery  2001  . Tubal ligation  1998  . Back surgery  1984  . Ovarian cysts removed    . Blephoplasty      blephoplasty  . Colonoscopy    . Breast lumpectomy with needle localization and axillary sentinel lymph node bx  03/20/2012    Procedure: BREAST LUMPECTOMY WITH NEEDLE LOCALIZATION AND AXILLARY SENTINEL LYMPH NODE BX;  Surgeon: Haywood Lasso, MD;  Location: [facility];  Service: General;  Laterality: Left;  with removal of skin tags from both breasts.  . Portacath placement  03/20/2012    Procedure: INSERTION PORT-A-CATH;  Surgeon: Haywood Lasso, MD;  Location: [facility];  Service: General;  Laterality: Right;  Right internal jugular  . Port-a-cath removal  04/23/2012    Procedure: MINOR REMOVAL PORT-A-CATH;  Surgeon: Haywood Lasso, MD;  Location: [facility];  Service: General;  Laterality: N/A;    FAMILY HISTORY Family History  Problem Relation Age of Onset  . Allergies Mother   . Asthma Mother   . Colon cancer Maternal Grandmother   . Prostate cancer Brother 37  the patient's father died at the age of 60 from a ruptured brain aneurysm. The  patient's mother died of "old age" at 91. The patient has one brother and one sister. The patient's maternal grandmother was diagnosed with colon cancer in her 9s. There is no history of breast or ovarian cancer in the family.  GYNECOLOGIC HISTORY: Menarche age 30, first live birth age 63. Menopause 2006. She is GX P2. She never took hormone replacement  SOCIAL HISTORY:  (Updated November 2014) Alicia Ferguson works as a Engineer, building services for [employer]. She is divorced, and lives alone, with no pets. Her son Alicia Ferguson 43 years old is graduatin with a degree in December 2014. Her daughter Alicia Ferguson 39 is studying Warden/ranger in [place].   ADVANCED DIRECTIVES: in place; the patient has named her sister, Alicia Ferguson, as her healthcare power of attorney. She can be reached at [place]: (Updated November 2014) Social History  Substance Use Topics  . Smoking status: Never Smoker   . Smokeless tobacco: Never Used  . Alcohol Use: Yes     Comment: rarely     Colonoscopy: Not on file  PAP: Not on file  Bone density: Never  Lipid panel: Not on file  No Known Allergies  Current Outpatient Prescriptions  Medication Sig Dispense Refill  . calcium-vitamin D (OSCAL) 250-125 MG-UNIT per tablet Take 1 tablet by mouth every other day.    . cholecalciferol (VITAMIN D) 1000 UNITS tablet Take 1,000 Units by mouth every other day.    . fesoterodine (TOVIAZ) 4 MG TB24 tablet Take 4 mg by mouth daily.    Marland Kitchen ibuprofen (ADVIL,MOTRIN) 200 MG tablet Take 800 mg by mouth every 8 (eight) hours as needed for moderate pain. For pain    . ramipril (ALTACE) 10 MG capsule Take 20 capsules by mouth daily.      No current facility-administered medications for this visit.    OBJECTIVE: Middle-aged white woman in no acute distress Filed Vitals:   02/22/15 1445  BP: 153/85  Pulse: 81  Temp: 98.1 F (36.7 C)  Resp: 18     Body mass index is 45.09 kg/(m^2).    ECOG FS: 0 Filed Weights    02/22/15 1445  Weight: 279 lb 3.2 oz (126.644 kg)   Sclerae unicteric, EOMs intact Oropharynx clear, dentition in good repair No cervical or supraclavicular adenopathy Lungs no rales or rhonchi Heart regular rate and rhythm Abd soft, obese, nontender, positive bowel sounds MSK no focal spinal tenderness, no upper extremity lymphedema Neuro: nonfocal, well oriented, appropriate affect Breasts: The right breast is unremarkable. The left breast is status post lumpectomy and radiation. There is no evidence of local recurrence. The left axilla is benign.    LAB RESULTS: Lab Results  Component Value Date   WBC 7.5 02/12/2015   NEUTROABS 4.8 02/12/2015   HGB 12.4 02/12/2015   HCT 37.6 02/12/2015   MCV 82.9 02/12/2015   PLT 327 02/12/2015  Chemistry      Component Value Date/Time   NA 142 02/12/2015 1537   NA 139 03/12/2012 1431   K 3.9 02/12/2015 1537   K 3.8 03/12/2012 1431   CL 108* 08/16/2012 0818   CL 101 03/12/2012 1431   CO2 27 02/12/2015 1537   CO2 30 03/12/2012 1431   BUN 12.4 02/12/2015 1537   BUN 14 03/12/2012 1431   CREATININE 0.8 02/12/2015 1537   CREATININE 0.80 03/12/2012 1431      Component Value Date/Time   CALCIUM 8.9 02/12/2015 1537   CALCIUM 9.2 03/12/2012 1431   ALKPHOS 55 02/12/2015 1537   AST 25 02/12/2015 1537   ALT 34 02/12/2015 1537   BILITOT 0.83 02/12/2015 1537        STUDIES: CLINICAL DATA: History of left breast cancer, diagnosed in 2013. Annual exam.  EXAM: DIGITAL DIAGNOSTIC BILATERAL MAMMOGRAM WITH 3D TOMOSYNTHESIS AND CAD  COMPARISON: Previous exam(s).  ACR Breast Density Category b: There are scattered areas of fibroglandular density.  FINDINGS: There are stable lumpectomy changes in the medial left breast. No mass, nonsurgical distortion, or suspicious microcalcification is identified in either breast to suggest malignancy.  Mammographic images were processed with CAD.  IMPRESSION: No evidence of  malignancy in either breast. Lumpectomy changes on the left.  RECOMMENDATION: Diagnostic mammogram is suggested in 1 year. (Code:DM-B-01Y)  I have discussed the findings and recommendations with the patient. Results were also provided in writing at the conclusion of the visit. If applicable, a reminder letter will be sent to the patient regarding the next appointment.  BI-RADS CATEGORY 2: Benign.   Electronically Signed  By: Curlene Dolphin M.D.  On: 12/22/2014 09:31  ASSESSMENT: 54 y.o. BRCA negative [place] woman   (1)  status post left lumpectomy 03/20/2012 for a pT1a pN0 invasive ductal carcinoma, grade 2, estrogen and progesterone receptor negative, HER-2 positive with a ratio of 2.81 by CISH, with an MIB-1 of 69%.  (2) biopsy 02/19/2012 of two additional lesions in the Left breast,  Benign  (3)  Status post radiation therapy, followed by observation alone.  PLAN:  Verdella continues to do well from a breast cancer point of view now 3 years out from her definitive surgery. She understands she had a very small tumor with an excellent prognosis so that the use of anti-estrogens was optional. She opted against them given the very marginal benefit and is being followed with observation alone.  She is now moving to the [place] area. She would like to be referred to an oncologist there who is on her insurance last. I was unable to bring that list up through the computer. She will contact her insurance and send me a list of the available names. I will then placed the appropriate referral. Since we have been seeing her only once a year, we have plenty of time to get that done.  Otherwise I will be glad to see her at any point in the future if and when the need arises, but as of now we're making no further routine appointments for Alicia Ferguson here Chauncey Cruel, MD      02/22/2015

## 2015-04-01 IMAGING — CT CT ABD-PELV W/O CM
1 series · 15 of 24 positions shown, 19 images · non-contrast
Comparison: None.

CLINICAL DATA: RIGHT flank pain.

EXAM:
CT ABDOMEN AND PELVIS WITHOUT CONTRAST
TECHNIQUE: Multidetector CT imaging of the abdomen and pelvis was performed
following the standard protocol without IV contrast.

[Series 6: lung · axial · 0.88mm/px · z∈[-172,-66]mm · 15 of 24 slices shown, 19 images]
[im 2/24  soft-tissue]
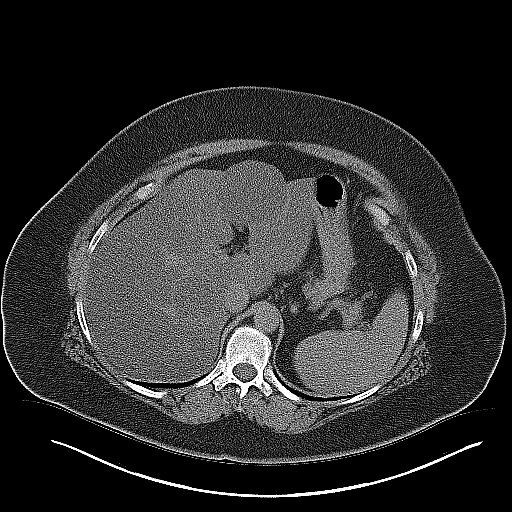
[im 2/24  bone]
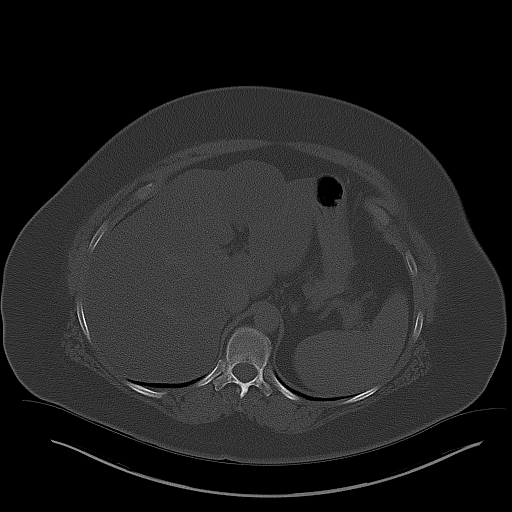
[im 4/24  soft-tissue]
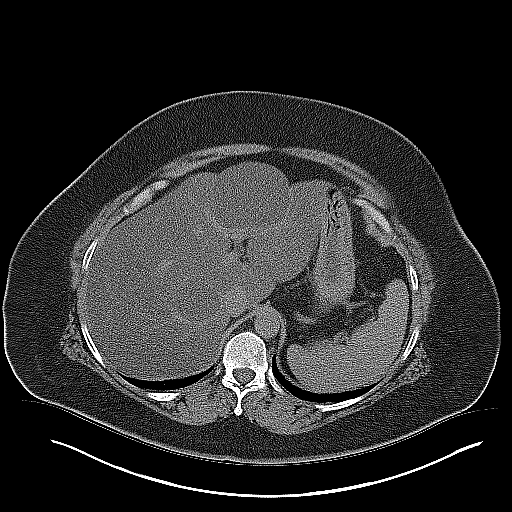
[im 6/24  soft-tissue]
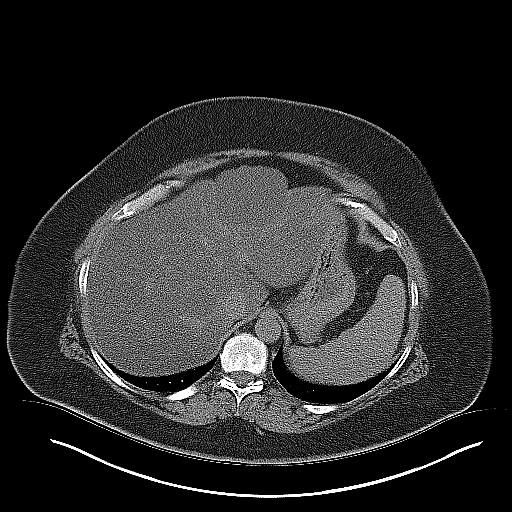
[im 7/24  soft-tissue]
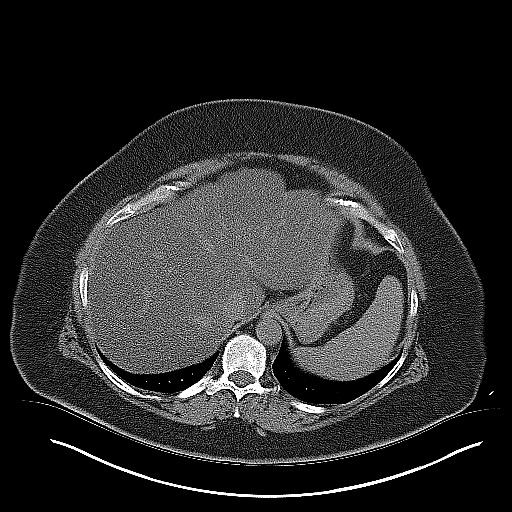
[im 9/24  soft-tissue]
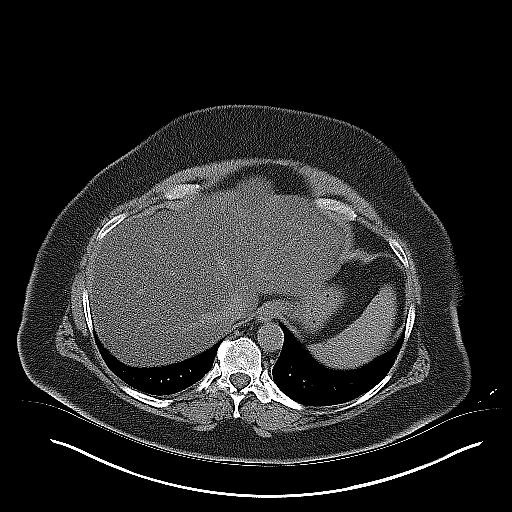
[im 11/24  soft-tissue]
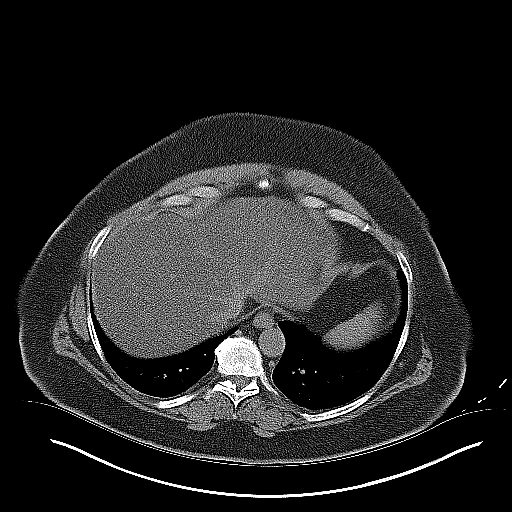
[im 13/24  soft-tissue]
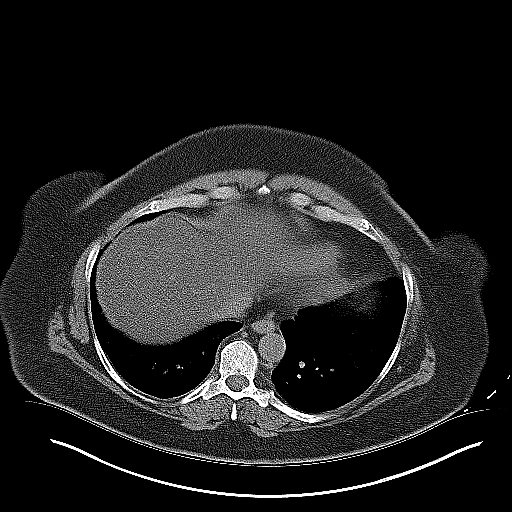
[im 14/24  soft-tissue]
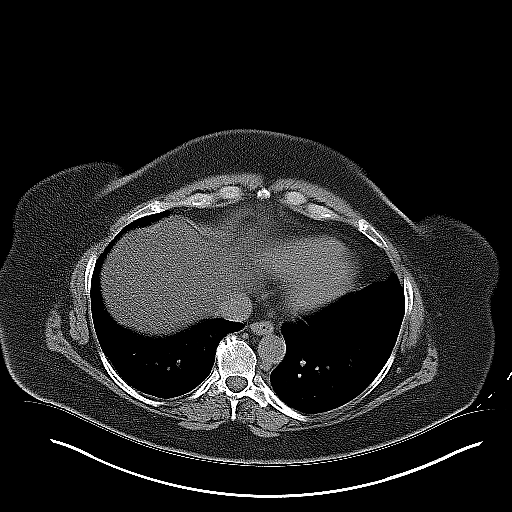
[im 16/24  soft-tissue]
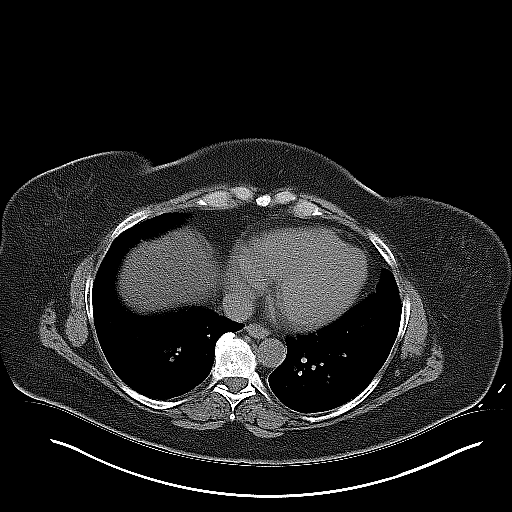
[im 16/24  bone]
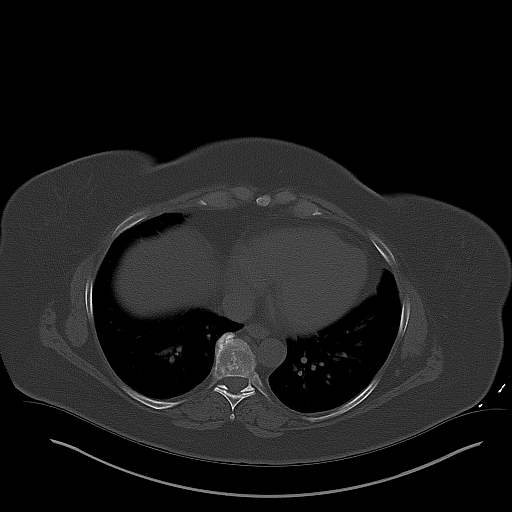
[im 18/24  soft-tissue]
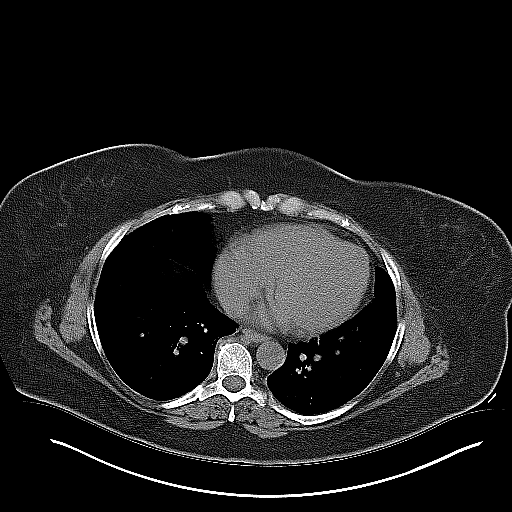
[im 19/24  soft-tissue]
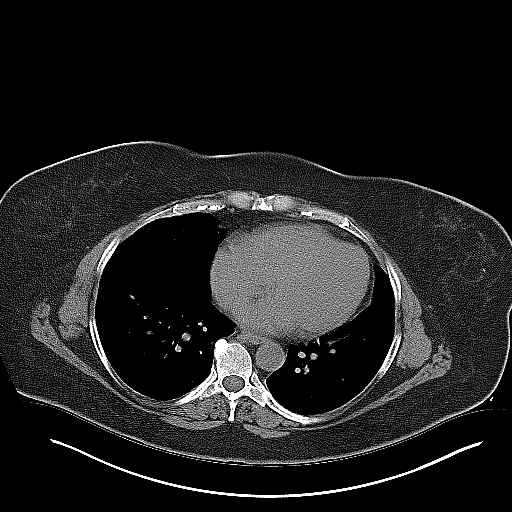
[im 20/24  lung]
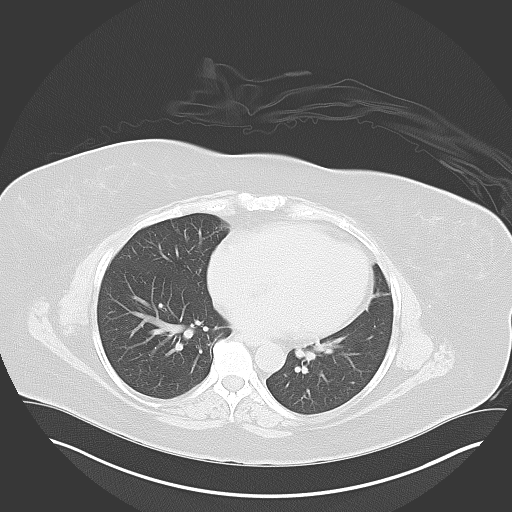
[im 21/24  soft-tissue]
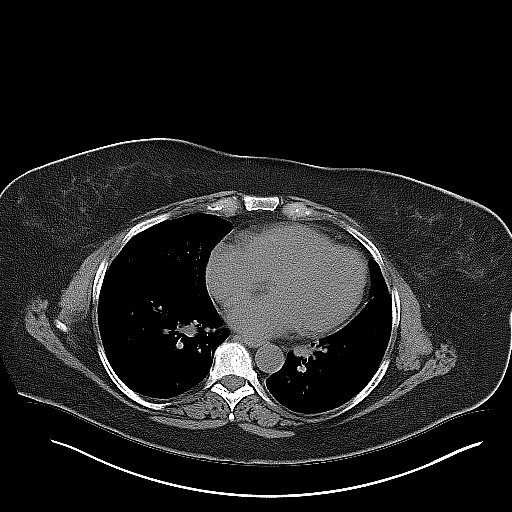
[im 21/24  lung]
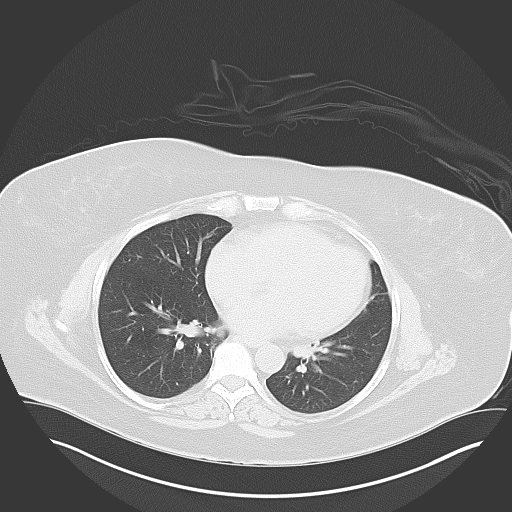
[im 22/24  lung]
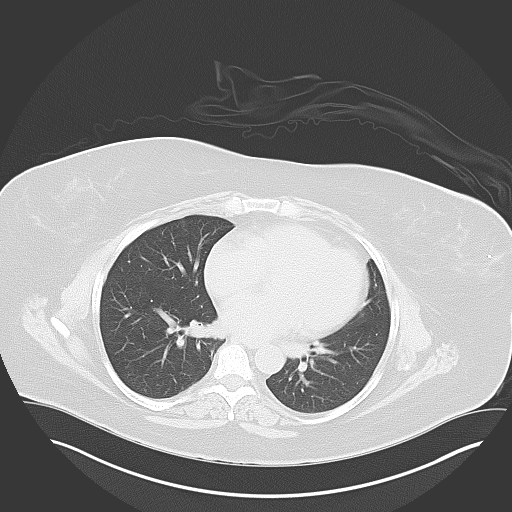
[im 23/24  soft-tissue]
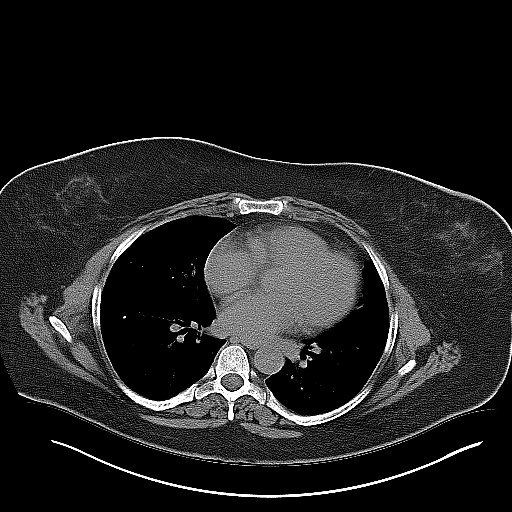
[im 23/24  lung]
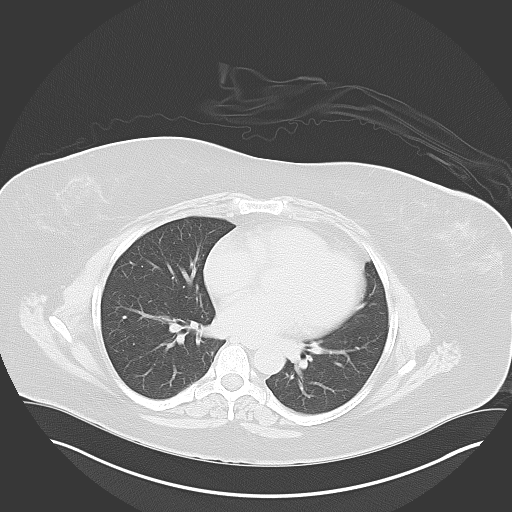

[15 of 24 positions shown; findings below may reference images not displayed]

FINDINGS: No intrarenal calculi on the RIGHT but moderate hydronephrosis and
hydroureter is present down to the level of the distal ureter where
a 3 mm calculus is observed lodged just above the ureterovesical
junction. The bladder is unremarkable.

Again, within limits of unenhanced technique, remaining visualized
upper abdomen unremarkable. Visualized extreme lung bases clear.
Advanced hepatic steatosis. 6.0 x 6.4 cm water density cyst within
the falciform ligament. Prior cholecystectomy. Increased body
habitus. Colonic diverticulosis without diverticulitis.

No appendiceal inflammation. Degenerative disc disease in the lumbar
spine at L5-S1.
IMPRESSION: RIGHT hydronephrosis and hydroureter secondary to a 3 mm calculus in
the distal ureter just above the UVJ.

LEFT nephrolithiasis.

Advanced hepatic steatosis.  6 cm hepatic cyst.

## 2015-10-08 ENCOUNTER — Encounter: Payer: Self-pay | Admitting: Genetic Counselor
